# Patient Record
Sex: Female | Born: 1945 | Race: Black or African American | Hispanic: No | Smoking: Current every day smoker
Health system: Southern US, Community
[De-identification: ages and names within clinical notes are randomized; demographics above are authoritative.]

## PROBLEM LIST (undated history)

## (undated) DIAGNOSIS — I10 Essential (primary) hypertension: Secondary | ICD-10-CM

## (undated) DIAGNOSIS — F17200 Nicotine dependence, unspecified, uncomplicated: Secondary | ICD-10-CM

## (undated) DIAGNOSIS — I729 Aneurysm of unspecified site: Secondary | ICD-10-CM

## (undated) HISTORY — DX: Nicotine dependence, unspecified, uncomplicated: F17.200

## (undated) HISTORY — DX: Essential (primary) hypertension: I10

## (undated) HISTORY — PX: CEREBRAL ANEURYSM REPAIR: SHX164

## (undated) HISTORY — PX: TOTAL ABDOMINAL HYSTERECTOMY: SHX209

## (undated) HISTORY — DX: Aneurysm of unspecified site: I72.9

---

## 2002-02-16 ENCOUNTER — Ambulatory Visit (HOSPITAL_COMMUNITY): Admission: RE | Admit: 2002-02-16 | Discharge: 2002-02-16 | Payer: Self-pay | Admitting: Cardiology

## 2002-03-08 ENCOUNTER — Encounter: Admission: RE | Admit: 2002-03-08 | Discharge: 2002-03-08 | Payer: Self-pay | Admitting: Cardiology

## 2002-03-08 ENCOUNTER — Encounter: Payer: Self-pay | Admitting: Cardiology

## 2002-07-08 ENCOUNTER — Other Ambulatory Visit: Admission: RE | Admit: 2002-07-08 | Discharge: 2002-07-08 | Payer: Self-pay | Admitting: Obstetrics and Gynecology

## 2002-08-05 ENCOUNTER — Encounter: Payer: Self-pay | Admitting: Obstetrics and Gynecology

## 2002-08-15 ENCOUNTER — Encounter (INDEPENDENT_AMBULATORY_CARE_PROVIDER_SITE_OTHER): Payer: Self-pay

## 2002-08-15 ENCOUNTER — Inpatient Hospital Stay (HOSPITAL_COMMUNITY): Admission: RE | Admit: 2002-08-15 | Discharge: 2002-08-17 | Payer: Self-pay | Admitting: Obstetrics and Gynecology

## 2003-12-07 ENCOUNTER — Encounter: Admission: RE | Admit: 2003-12-07 | Discharge: 2003-12-07 | Payer: Self-pay | Admitting: Neurosurgery

## 2003-12-25 ENCOUNTER — Inpatient Hospital Stay (HOSPITAL_COMMUNITY): Admission: AD | Admit: 2003-12-25 | Discharge: 2003-12-27 | Payer: Self-pay | Admitting: Neurosurgery

## 2005-05-14 ENCOUNTER — Encounter
Admission: RE | Admit: 2005-05-14 | Discharge: 2005-05-14 | Payer: Self-pay | Admitting: Physical Medicine and Rehabilitation

## 2007-04-08 ENCOUNTER — Encounter: Admission: RE | Admit: 2007-04-08 | Discharge: 2007-04-08 | Payer: Self-pay | Admitting: Neurosurgery

## 2008-07-20 ENCOUNTER — Emergency Department (HOSPITAL_COMMUNITY): Admission: EM | Admit: 2008-07-20 | Discharge: 2008-07-20 | Payer: Self-pay | Admitting: Emergency Medicine

## 2008-07-22 ENCOUNTER — Emergency Department (HOSPITAL_COMMUNITY): Admission: EM | Admit: 2008-07-22 | Discharge: 2008-07-23 | Payer: Self-pay | Admitting: Emergency Medicine

## 2008-07-22 ENCOUNTER — Emergency Department (HOSPITAL_COMMUNITY): Admission: EM | Admit: 2008-07-22 | Discharge: 2008-07-22 | Payer: Self-pay | Admitting: Emergency Medicine

## 2009-12-24 ENCOUNTER — Ambulatory Visit (HOSPITAL_COMMUNITY): Admission: RE | Admit: 2009-12-24 | Discharge: 2009-12-24 | Payer: Self-pay | Admitting: Family Medicine

## 2010-01-31 ENCOUNTER — Ambulatory Visit: Payer: Self-pay | Admitting: Vascular Surgery

## 2010-02-28 ENCOUNTER — Ambulatory Visit: Payer: Self-pay | Admitting: Cardiology

## 2010-02-28 ENCOUNTER — Inpatient Hospital Stay (HOSPITAL_COMMUNITY): Admission: EM | Admit: 2010-02-28 | Discharge: 2010-03-03 | Payer: Self-pay | Admitting: Emergency Medicine

## 2010-03-01 ENCOUNTER — Encounter (INDEPENDENT_AMBULATORY_CARE_PROVIDER_SITE_OTHER): Payer: Self-pay | Admitting: Internal Medicine

## 2010-03-01 ENCOUNTER — Ambulatory Visit: Payer: Self-pay | Admitting: Vascular Surgery

## 2010-07-16 ENCOUNTER — Encounter (INDEPENDENT_AMBULATORY_CARE_PROVIDER_SITE_OTHER): Payer: Self-pay | Admitting: Internal Medicine

## 2010-07-16 ENCOUNTER — Ambulatory Visit
Admission: RE | Admit: 2010-07-16 | Discharge: 2010-07-16 | Payer: Self-pay | Source: Home / Self Care | Attending: Internal Medicine | Admitting: Internal Medicine

## 2010-10-04 LAB — LIPID PANEL: VLDL: 24 mg/dL (ref 0–40)

## 2010-10-04 LAB — BASIC METABOLIC PANEL
BUN: 11 mg/dL (ref 6–23)
BUN: 6 mg/dL (ref 6–23)
BUN: 9 mg/dL (ref 6–23)
CO2: 24 mEq/L (ref 19–32)
CO2: 25 mEq/L (ref 19–32)
Chloride: 106 mEq/L (ref 96–112)
Chloride: 108 mEq/L (ref 96–112)
Creatinine, Ser: 0.63 mg/dL (ref 0.4–1.2)
GFR calc non Af Amer: >60 mL/min (ref >60)
Glucose, Bld: 110 mg/dL — ABNORMAL HIGH (ref 70–99)
Glucose, Bld: 116 mg/dL — ABNORMAL HIGH (ref 70–99)
Glucose, Bld: 116 mg/dL — ABNORMAL HIGH (ref 70–99)
Potassium: 2.9 mEq/L — ABNORMAL LOW (ref 3.5–5.1)
Potassium: 3.7 mEq/L (ref 3.5–5.1)
Potassium: 4.1 mEq/L (ref 3.5–5.1)
Sodium: 137 mEq/L (ref 135–145)
Sodium: 141 mEq/L (ref 135–145)

## 2010-10-04 LAB — DIFFERENTIAL
Basophils Relative: 0 % (ref 0–1)
Eosinophils Absolute: 0.1 10*3/uL (ref 0.0–0.7)
Eosinophils Relative: 2 % (ref 0–5)
Lymphocytes Relative: 34 % (ref 12–46)
Lymphs Abs: 2.9 10*3/uL (ref 0.7–4.0)
Monocytes Absolute: 0.6 10*3/uL (ref 0.1–1.0)
Monocytes Relative: 7 % (ref 3–12)
Neutro Abs: 5 10*3/uL (ref 1.7–7.7)
Neutrophils Relative %: 57 % (ref 43–77)

## 2010-10-04 LAB — POCT I-STAT, CHEM 8
BUN: 8 mg/dL (ref 6–23)
Chloride: 110 mEq/L (ref 96–112)
Glucose, Bld: 98 mg/dL (ref 70–99)
Hemoglobin: 17.7 g/dL — ABNORMAL HIGH (ref 12.0–15.0)
Potassium: 3.6 mEq/L (ref 3.5–5.1)
Sodium: 141 mEq/L (ref 135–145)

## 2010-10-04 LAB — URINALYSIS, ROUTINE W REFLEX MICROSCOPIC
Hgb urine dipstick: NEGATIVE
Ketones, ur: NEGATIVE mg/dL
Nitrite: NEGATIVE
Specific Gravity, Urine: 1.02 (ref 1.005–1.030)
Urobilinogen, UA: 0.2 mg/dL (ref 0.0–1.0)

## 2010-10-04 LAB — CARDIAC PANEL(CRET KIN+CKTOT+MB+TROPI)
CK, MB: 1.7 ng/mL (ref 0.3–4.0)
Relative Index: 1.5 (ref 0.0–2.5)
Relative Index: 1.6 (ref 0.0–2.5)
Total CK: 113 U/L (ref 7–177)
Troponin I: 0.01 ng/mL (ref 0.00–0.06)
Troponin I: 0.02 ng/mL (ref 0.00–0.06)

## 2010-10-04 LAB — PROTIME-INR: INR: 1 (ref 0.00–1.49)

## 2010-10-04 LAB — ALDOSTERONE + RENIN ACTIVITY W/ RATIO: Aldosterone: <1 ng/dL

## 2010-10-04 LAB — CBC
HCT: 43 % (ref 36.0–46.0)
MCH: 32.6 pg (ref 26.0–34.0)
MCHC: 35 g/dL (ref 30.0–36.0)
MCV: 93.1 fL (ref 78.0–100.0)
MCV: 93.1 fL (ref 78.0–100.0)
Platelets: 285 10*3/uL (ref 150–400)
RBC: 5.21 MIL/uL — ABNORMAL HIGH (ref 3.87–5.11)
RDW: 14 % (ref 11.5–15.5)
RDW: 14.1 % (ref 11.5–15.5)
WBC: 8.7 10*3/uL (ref 4.0–10.5)
WBC: 9.2 10*3/uL (ref 4.0–10.5)

## 2010-10-04 LAB — URINE CULTURE: Culture  Setup Time: 201108120041

## 2010-10-04 LAB — POCT CARDIAC MARKERS
CKMB, poc: <1 ng/mL — ABNORMAL LOW (ref 1.0–8.0)
Troponin i, poc: <0.05 ng/mL (ref 0.00–0.09)

## 2010-10-04 LAB — TSH: TSH: 1.896 u[IU]/mL (ref 0.350–4.500)

## 2010-10-04 LAB — METANEPHRINES, PLASMA
Metanephrine, Free: <25 pg/mL (ref <=57)
Total Metanephrines-Plasma: 59 pg/mL (ref <=205)

## 2010-10-04 LAB — BRAIN NATRIURETIC PEPTIDE: Pro B Natriuretic peptide (BNP): <30 pg/mL (ref 0.0–100.0)

## 2010-11-04 LAB — POCT I-STAT, CHEM 8
BUN: 8 mg/dL (ref 6–23)
Chloride: 106 mEq/L (ref 96–112)
Potassium: 3.2 mEq/L — ABNORMAL LOW (ref 3.5–5.1)
Sodium: 138 mEq/L (ref 135–145)
TCO2: 24 mmol/L (ref 0–100)

## 2010-11-04 LAB — ABO/RH: ABO/RH(D): B POS

## 2010-11-04 LAB — TYPE AND SCREEN: ABO/RH(D): B POS

## 2010-12-03 NOTE — Consult Note (Signed)
NEW PATIENT CONSULTATION   Erica Baker, Erica Baker  DOB:  [birth year]                                       01/31/2010  ZOXWR#:[id number]   I saw the patient in the office today in consultation concerning an  abdominal aortic aneurysm.  She was referred by Dr. Lovell Sheehan.  This is a  pleasant 65 year old woman who was having some vague chest discomfort  and also some nausea.  Part of her workup for this included an  ultrasound of the abdomen which was performed at [facility] on  12/24/2009.  This showed an infrarenal abdominal aortic aneurysm with a  maximum diameter of 3.3 cm.  She was sent for vascular consultation.  The patient has not had any new onset abdominal pain.  She has had some  chronic left-sided lower back pain which she has had since a fall down  some stairs approximately 7 years ago.  Her pain in her back is brought  on by activity and relieved somewhat with rest and leg elevation.  There  are no other aggravating or alleviating factors.   Her past medical history is significant for hypertension.  She denies  any history of diabetes, hypercholesterolemia, history of previous  myocardial infarction, history of congestive heart failure or history of  COPD.   Her past surgical history is significant for removal of a cyst from her  lower back in 2004.  She had a hysterectomy in 2003.   FAMILY HISTORY:  She is unaware of any history of abdominal aortic  aneurysms in her family.  She has had no history of premature  cardiovascular disease.   SOCIAL HISTORY:  She is divorced.  She has one child.  She smokes three-  quarters of a pack per day of cigarettes and has been smoking since she  was a teenager.   REVIEW OF SYSTEMS:  GENERAL:  She has had no recent weight loss, weight  gain or problems with her appetite.  She is 225 pounds, 5 feet 8 inches  tall.  CARDIOVASCULAR:  She has had no chest pain, chest pressure, palpitations  or arrhythmias.  She  does admit to some dyspnea on exertion and  orthopnea at times.  She has had some pain in the posterior aspect of  the right leg since her fall 7 years ago.  I do not get any clear-cut  history of claudication, rest pain or nonhealing ulcers.  She has had no  history of stroke, TIAs or amaurosis fugax.  She has had no history of  DVT or phlebitis.  GI:  She has occasional constipation.  She has no history of reflux or  hiatal hernia.  MUSCULOSKELETAL:  She has some muscle pain in her right leg as described  above.  She has had no significant arthritis.  ENT:  She has occasional nosebleeds off and on for the last 2 years.  Pulmonary, GU, neurologic, psychiatric, hematologic and integumentary  review of systems is unremarkable and is documented on the medical  history form in her chart.   PHYSICAL EXAMINATION:  General:  This is a pleasant 65 year old woman  who appears her stated age.  Vital signs:  Her blood pressure is  188/104, heart rate is 66, respiratory rate is 20.  HEENT:  Unremarkable.  Lungs:  Are clear bilaterally to auscultation without  rales, rhonchi  or wheezing.  Cardiovascular:  I do not detect any  carotid bruits.  She has a regular rate and rhythm.  She has palpable  femoral pulses and warm, well-perfused feet without ischemic ulcers.  She has mild bilateral lower extremity swelling.  Abdomen:  Her abdomen  is soft and nontender.  She has normal pitched bowel sounds.  I cannot  palpate her aneurysm because of her size.  Musculoskeletal:  There are  no major deformities or cyanosis.  Neurological:  She has no focal  weakness or paresthesias.  Skin:  There are no ulcers or rashes.   I have reviewed her ultrasound which shows a maximum diameter of her  aneurysm is 3.3 cm.  I have also reviewed her EKG which shows a normal  sinus rhythm.   I have explained that we typically would not consider elective repair of  an aneurysm in a normal risk patient unless it reached 5.5  cm in maximum  diameter.  I have explained that the two main risk factors associated  with continued enlargement of the aneurysm are poorly controlled blood  pressure and smoking.  I have had a long discussion with her about the  importance of tobacco cessation and she does understand that continued  tobacco use does increase her risk of aneurysm enlargement and rupture.  I have given her literature for [facility] free tobacco smoking  cessation program on the second Wednesday of every month and also  information for [facility] tobacco cessation program.  I have ordered a  followup ultrasound in 6 months and I will see her back at that time.  If the aneurysm is stable in size we will likely stretch her followup  out to 1 year.  We have also spent some time today discussing the  options of repair if in fact her aneurysm ever enlarges enough to  consider elective repair.     Di Kindle. Edilia Bo, M.D.  Electronically Signed   CSD/MEDQ  D:  01/31/2010  T:  02/01/2010  Job:  [id number]   cc:   Della Goo, M.D.

## 2010-12-06 NOTE — Op Note (Signed)
Erica Baker, WHITIS                         ACCOUNT NO.:  [id number]   MEDICAL RECORD NO.:  [id number]                   PATIENT TYPE:  OIB   LOCATION:  2860                                 FACILITY:  [facility]   PHYSICIAN:  Donalee Citrin, M.D.                     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   OPERATION PERFORMED:  Decompressive laminectomy L4-5 for resection of right-  sided large synovial cyst with associated hemorrhage.   SURGEON:  Donalee Citrin, M.D.   ASSISTANT:  Tia Alert, MD   ANESTHESIA:  General endotracheal.   INDICATIONS FOR PROCEDURE:  The patient is a very pleasant 65 year old  female who has had back and leg pain ever since a work related injury and  fall several months ago.  The patient has refractory back and right leg pain  radiating down to the top of the big toe with weakness on dorsiflexion and  extensor hallucis longus.  Preoperative imaging showed what appeared to be a  large synovial cyst.  Patient's symptomatology began after this work related  injury, was completely asymptomatic before and afterward.  Due to the  appearance of the synovial cyst, it was felt probably could represent some  hemorrhage overlying within the cyst.  Due to failure of conservative  treatment and progressive weakness of the right foot, the patient is  recommended decompressive laminectomy and resection of cysts.  I extensively  went over the risks and benefits of surgery.  She understands and agrees to  proceed forward.   DESCRIPTION OF PROCEDURE:  The patient was brought to the operating room  where she was induced under general anesthesia.  The patient was placed  prone on the Wilson frame.  Back was prepped and draped in the usual sterile  fashion.  Preoperative x-ray  localized the L4-5 disk space.  After  infiltration of 10 mL of lidocaine with epinephrine, a midline incision was  made and Bovie electrocautery was used to taken down the subcutaneous tissue  and subperiosteal dissection was carried out to the lamina of L4 and L5 on  the right.  Then the self-retaining retractor was placed.  Intraoperative x-  ray confirmed localization of this to be the L4-5 disk space.  Then using a  high speed drill, the medial aspect of the facet complex and inferior aspect  of lamina of L4 was drilled down.  Then using a 3 and 4 mm Kerrison punches  transversely the entire lamina of L4 and half the superior aspect of the  lamina of L5 was removed, medial aspect of the facet complex exposing  severely hypertrophied ligament.  The ligament was removed in piecemeal  fashion exposing the  central thecal sac.  Then using a 4 Penfield, the  remainder of the lateral ligament was resected off the thecal sac and the  large synovial cyst was immediately visualized.  The synovial cyst was  dissected free of the thecal sac and identified from its cephalocaudad  dimensions.  After the thecal  sac and underlying L5 nerve root was  dissected off the medial aspect of the cyst, the cyst was underbitten from  cephalad to caudad dimensions.  Upon immediately abutting into the cyst, a  large amount of clear synovial fluid was expressed as well as a large amount  of dark black old blood.  This cyst was progressively resected with a  combination of blunt dissection with a penfield and Kerrison rongeurs and  pituitary rongeurs.  After resection of the cyst, the lateral gutter and  pedicle was scraped to remove any residual lateral cyst wall.  The L5 nerve  root was clearly identified out is foramen and noted to be free of any  further remnants of the cyst.  The cyst was completely resected and it was  felt no further wall remained.  The disk was intact and it was not necessary  to violate  this.  After resection of the cyst, the thecal sac and L5 nerve  root were markedly decompressed.  The wound was then copiously irrigated  and meticulous hemostasis was maintained.  Gelfoam was overlaid on top of  the dura.  The muscle and fascia were reapproximated with 0 interrupted  Vicryl.  The subcutaneous tissue was closed with 2-0 interrupted Vicryl.  The skin was closed with running 4-0 subcuticular.  Benzoin and Steri-Strips  applied.  The patient was then transferred to the recovery room in stable  condition.  At the end of the case, the sponge and needle counts were  correct.                                               Donalee Citrin, M.D.    GC/MEDQ  D:  12/25/2003  T:  12/25/2003  Job:  [id number]

## 2010-12-06 NOTE — H&P (Signed)
Erica Baker, Erica Baker                         ACCOUNT NO.:  [id number]   MEDICAL RECORD NO.:  [id number]                   PATIENT TYPE:  INP   LOCATION:  NA                                   FACILITY:  [facility]   PHYSICIAN:  Daniel L. Eda Paschal, M.D.           DATE OF BIRTH:  03-30-1946   DATE OF ADMISSION:  DATE OF DISCHARGE:                                HISTORY & PHYSICAL   CHIEF COMPLAINT:  Multiple leiomyoma uteri with persistent menometorrhagia.   HISTORY OF PRESENT ILLNESS:  The patient is a 65 year old, gravida 1, para  1, AB 0, who came to my office with a history of persistent menometorrhagia.  She has been treated both with oral progestins and Megace without any  relief.  Endometrial biopsy came back benign.  Sonohysterogram was done.  The patient has multiple myomas including a submucous one which accounts for  the above.  She has a total of three fibroids that are 3 cm or more.  As a  result of all of the above, she now enters the hospital for total abdominal  hysterectomy and bilateral salpingo-oophorectomy.  It is possible that one  of her ovaries was removed back in 1980-something, but she is not sure.  But  regardless, she would like to have her ovaries removed at 56 at the time of  the surgery.   PAST MEDICAL HISTORY:  The patient is hypertensive and takes Lasix and  potassium for the above.  The patient also has history of GERD and treats  that occasionally.   PRESENT MEDICATIONS:  Lasix and potassium.   ALLERGIES:  None.   FAMILY HISTORY:  Her mother and father had hypertension.   PAST SURGICAL HISTORY:  The patient had laparotomy with possible removal of  one ovary and tube in 1980's.  She is not really sure why that was done,  although she thought it might have been for endometriosis.  She has also had  a benign lump removed from her breast.   REVIEW OF SYSTEMS:  HEENT:  Negative.  CARDIAC:  Hypertension.  GI:  GERD.  RESPIRATORY:  A history of  coughing.  GU:  Negative.  NEUROMUSCULAR:  Negative.  ALLERGIC:  Negative.  IMMUNOLOGICAL:  Negative.  LYMPHATIC:  Negative.  ENDOCRINE:  Negative.   PHYSICAL EXAMINATION:  GENERAL:  The patient is a well-developed, well-  nourished female in no acute distress.  VITAL SIGNS:  Blood pressure 150/80, pulse 80 and regular, respirations 16  and nonlabored.  She is afebrile.  HEENT:  All within normal limits.  NECK:  Supple.  Trachea in the midline.  Thyroid is not enlarged.  LUNGS:  Clear to P&A.  HEART:  No thrills, heaves, or murmurs.  BREASTS:  No masses.  ABDOMEN:  Soft without guarding, rebound, or masses.  PELVIC:  External and vaginal is within normal limits.  Cervix is clean.  Pap smear shows no atypia.  Uterus is enlarged by multiple myomas to almost  three times normal size.  Adnexa fails to reveal any pathology.  Rectovaginal is negative.  EXTREMITIES:  Within normal limits.   ADMISSION IMPRESSION:  Persistent menometrorrhagia with leiomyoma uteri.   PLAN:  Total abdominal hysterectomy, bilateral salpingo-oophorectomy.   ADDENDUM TO THE ABOVE:  A more precise listing of her medications:  1. Megace 40 mg t.i.d.  2. Nifedipine ER 60 mg 1 daily.  3. Naproxen DS 1 t.i.d. p.r.n.  4. Potassium 10 mg 1 daily.  5. Bumetanide 0.5 mg 1 daily p.r.n.                                               Daniel L. Eda Paschal, M.D.    Erica Baker  D:  08/15/2002  T:  08/15/2002  Job:  [id number]

## 2010-12-06 NOTE — Discharge Summary (Signed)
   Erica Baker, Erica Baker                         ACCOUNT NO.:  [id number]   MEDICAL RECORD NO.:  [id number]                   PATIENT TYPE:  INP   LOCATION:  0464                                 FACILITY:  [facility]   PHYSICIAN:  Daniel L. Eda Paschal, M.D.           DATE OF BIRTH:  [birth year]   DATE OF ADMISSION:  08/15/2002  DATE OF DISCHARGE:  08/17/2002                                 DISCHARGE SUMMARY   HOSPITAL COURSE:  The patient is a 65 year old female who was admitted to  the hospital with multiple fibroids and persistent menometorrhagia for  tentative surgery.  On the day of admission she was taken to the operating  room.  Exploratory laparotomy was performed.  The patient had massive pelvic  adhesions.  All pelvic adhesions were lysed.  She then underwent a total  abdominal hysterectomy, left salpingo-oophorectomy.  Postoperatively her  course continued uneventfully.  By the second postoperative day she was  ready for discharge.   DISCHARGE MEDICATIONS:  Percocet or Motrin for pain relief.   ACTIVITY:  Ambulatory.   DIET:  Regular.   FOLLOW-UP:  She is going to return to the office one week from her surgery  for staple removal.   PATHOLOGY:  Final pathology revealed uterus with multiple leiomyomata, tubal  endometriosis, uterine serosal endosalpingiosis.   CONDITION ON DISCHARGE:  Improved.   DISCHARGE DIAGNOSES:  1. Menometrorrhagia.  2. Endometriosis.  3. Leiomyomata uteri.  4. Endosalpingiosis.  5. Pelvic adhesive disease.   OPERATION:  1. Exploratory laparotomy with lysis of pelvic adhesions.  2. Total abdominal hysterectomy.  3. Left salpingo-oophorectomy.                                               Daniel L. Eda Paschal, M.D.    Tonette Bihari  D:  08/31/2002  T:  08/31/2002  Job:  [id number]

## 2010-12-06 NOTE — Op Note (Signed)
Erica Baker, Erica Baker                         ACCOUNT NO.:  [id number]   MEDICAL RECORD NO.:  [id number]                   PATIENT TYPE:  INP   LOCATION:  X009                                 FACILITY:  [facility]   PHYSICIAN:  Rande Brunt. Eda Paschal, M.D.           DATE OF BIRTH:  12/10/1945   DATE OF PROCEDURE:  08/15/2002  DATE OF DISCHARGE:                                 OPERATIVE REPORT   PREOPERATIVE DIAGNOSES:  1. Menometrorrhagia.  2. Fibroids.   POSTOPERATIVE DIAGNOSES:  1. Menometrorrhagia.  2. Fibroids.  3. Pelvic adhesive disease.   PROCEDURES:  1. Exploratory laparotomy with lysis of pelvic adhesions.  2. Total abdominal hysterectomy, left salpingo-oophorectomy.   SURGEON:  Daniel L. Eda Paschal, M.D.   ASSISTANTNadyne Coombes. Fontaine, M.D.   ANESTHESIA:  General endotracheal.   FINDINGS:  At the time of this surgery the patient's peritoneal cavity was  severely distorted by adhesive disease and there was mesentery that was  completely layered over the uterus and attached to the peritoneum, making  identification of the uterus and the left ovary and tube impossible  initially.  Once this had been removed, her left tube was dilated by a  hydrosalpinx and her left ovary was densely adherent to the peritoneum.  The  sigmoid colon was densely adherent to the uterus posteriorly.  The right  ovary and fallopian tube were surgically absent.   DESCRIPTION OF PROCEDURE:  After adequate general endotracheal anesthesia,  the patient was placed in the supine position and prepped and draped in the  usual sterile manner.  A Foley catheter was inserted in the patient's  bladder. The patient's previous midline vertical incision was removed.  The  fascia was opened vertically, as was the peritoneum.  Subcutaneous bleeders  were clamped and bovied as encountered.  When the peritoneal cavity was  opened, the above findings were noted.  First mesentery was freed up from  the top of  the uterus from the peritoneum and the excessive mesentery was  excised.  Some of it was omentum and  some of it was mesentery from the  sigmoid.  Most of it was omentum.  Once this had been done, the uterus could  be identified.  The colon was densely adherent to it.  The round ligaments  could be identified.  They were sutured and cut.  The retroperitoneal space  was entered.  The ureters were identified bilaterally.  On the left the  infundibulopelvic ligament was clamped, cut, and doubly suture ligated.  On  the right the utero-ovarian ligament was clamped, cut, and doubly suture  ligated as there was no ovary and tube on the right.  The bladder flap was  taken down by sharp dissection . The uterine arteries were clamped, cut, and  doubly suture ligated  with #1 chromic catgut.  At this point a  supracervical hysterectomy was done and then taking extreme care, the  sigmoid colon was dissected off the cervix until it was below the vagina.  The parametrium was then taken down in successive bites by clamping,  cutting, and suture ligating.  The cervicovaginal junction was identified  and entered with sharp dissection, and the cervix was also removed.  Suture  material for the above-mentioned pedicles was #1 chromic catgut.  The angles  of the vagina were sutured with #1 chromic catgut, incorporating cardinal  and uterosacral ligaments.  The vaginal cuff was whipstitched with a running  locking 0 Vicryl.  Copious irrigation was done with Ringer's lactate.  The  ureters were once rechecked.  They still were free without any injury.  The  sigmoid colon was free without any injury.  There was no bleeding noted.  The peritoneum and the fascia were closed with two running 0 PDS using  looped 0 PDS.  Copious irrigation was then done in the suprafascial area,  having previously copiously irrigated the peritoneal cavity, and then the  skin was closed with staples.  Estimated blood loss for the  entire procedure  was 200 mL with none replaced.  The patient tolerated the procedure well and  left the operating room in satisfactory condition, draining clear urine from  her Foley catheter.                                               Daniel L. Eda Paschal, M.D.    Tonette Bihari  D:  08/15/2002  T:  08/15/2002  Job:  [id number]

## 2010-12-13 ENCOUNTER — Emergency Department (HOSPITAL_COMMUNITY)
Admission: EM | Admit: 2010-12-13 | Discharge: 2010-12-14 | Disposition: A | Discharge status: Home / Self Care | Payer: BC Managed Care – PPO | Attending: Emergency Medicine | Admitting: Emergency Medicine

## 2010-12-13 ENCOUNTER — Emergency Department (HOSPITAL_COMMUNITY): Payer: BC Managed Care – PPO

## 2010-12-13 DIAGNOSIS — I10 Essential (primary) hypertension: Secondary | ICD-10-CM | POA: Insufficient documentation

## 2010-12-13 DIAGNOSIS — Z7982 Long term (current) use of aspirin: Secondary | ICD-10-CM | POA: Insufficient documentation

## 2010-12-13 DIAGNOSIS — R109 Unspecified abdominal pain: Secondary | ICD-10-CM | POA: Insufficient documentation

## 2010-12-13 DIAGNOSIS — Z79899 Other long term (current) drug therapy: Secondary | ICD-10-CM | POA: Insufficient documentation

## 2010-12-13 LAB — COMPREHENSIVE METABOLIC PANEL
ALT: 6 U/L (ref 0–35)
AST: 12 U/L (ref 0–37)
Albumin: 3.5 g/dL (ref 3.5–5.2)
Alkaline Phosphatase: 63 U/L (ref 39–117)
Chloride: 102 mEq/L (ref 96–112)
GFR calc Af Amer: >60 mL/min (ref >60)
Potassium: 2.8 mEq/L — ABNORMAL LOW (ref 3.5–5.1)
Total Bilirubin: 0.5 mg/dL (ref 0.3–1.2)

## 2010-12-13 LAB — URINALYSIS, ROUTINE W REFLEX MICROSCOPIC
Glucose, UA: NEGATIVE mg/dL
Protein, ur: NEGATIVE mg/dL
Specific Gravity, Urine: 1.016 (ref 1.005–1.030)
Urobilinogen, UA: 1 mg/dL (ref 0.0–1.0)

## 2010-12-13 LAB — DIFFERENTIAL
Basophils Relative: 0 % (ref 0–1)
Eosinophils Absolute: 0.2 10*3/uL (ref 0.0–0.7)
Lymphs Abs: 3.8 10*3/uL (ref 0.7–4.0)
Neutro Abs: 3.6 10*3/uL (ref 1.7–7.7)
Neutrophils Relative %: 44 % (ref 43–77)

## 2010-12-13 LAB — CBC
Platelets: 298 10*3/uL (ref 150–400)
RBC: 4.86 MIL/uL (ref 3.87–5.11)
WBC: 8.3 10*3/uL (ref 4.0–10.5)

## 2010-12-14 MED ORDER — IOHEXOL 350 MG/ML SOLN
100.0000 mL | Freq: Once | INTRAVENOUS | Status: AC | PRN
  Administered 2010-12-14: 100 mL via INTRAVENOUS

## 2011-04-25 LAB — CBC
MCHC: 34 g/dL (ref 30.0–36.0)
MCV: 91.3 fL (ref 78.0–100.0)
Platelets: 312 10*3/uL (ref 150–400)

## 2011-04-25 LAB — DIFFERENTIAL
Basophils Relative: 1 % (ref 0–1)
Eosinophils Absolute: 0.1 10*3/uL (ref 0.0–0.7)
Monocytes Relative: 5 % (ref 3–12)
Neutrophils Relative %: 65 % (ref 43–77)

## 2011-04-25 LAB — BASIC METABOLIC PANEL
Calcium: 9.2 mg/dL (ref 8.4–10.5)
GFR calc Af Amer: >60 mL/min (ref >60)
GFR calc non Af Amer: >60 mL/min (ref >60)
Glucose, Bld: 116 mg/dL — ABNORMAL HIGH (ref 70–99)
Sodium: 137 mEq/L (ref 135–145)

## 2011-08-18 IMAGING — CT CT CTA ABD/PEL W/CM AND/OR W/O CM
2 of 7 series · 15 of 46 positions shown, 19 images · IV contrast (omnipaque)
Comparison: Ultrasound 12/24/2009.

CLINICAL DATA: Lower abdominal pain radiating to lower back.
Aneurysm is seen on outside ultrasound.

CT ANGIOGRAPHY ABDOMEN AND PELVIS
TECHNIQUE: Multidetector CT imaging of the abdomen and pelvis was
performed using the standard protocol during bolus administration
of intravenous contrast.  Multiplanar reconstructed images
including MIPs were obtained and reviewed to evaluate the vascular
anatomy.
Contrast:  100 ml Omnipaque 350

[Series 6: dissection 2.0 st · axial · 0.78mm/px · z∈[-687,-337]mm · 12 of 205 slices shown, 16 images]
[im 20/205  soft-tissue]
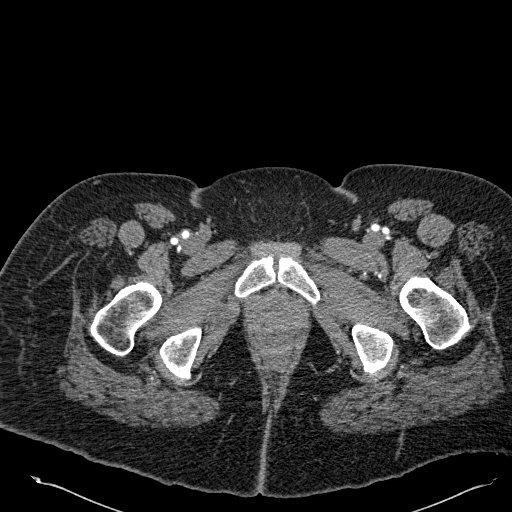
[im 20/205  bone]
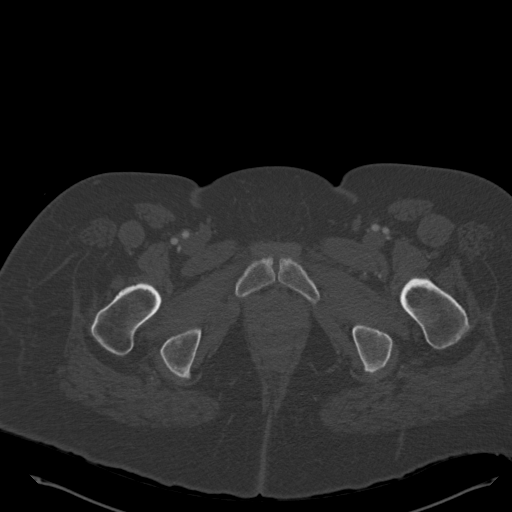
[im 39/205  soft-tissue]
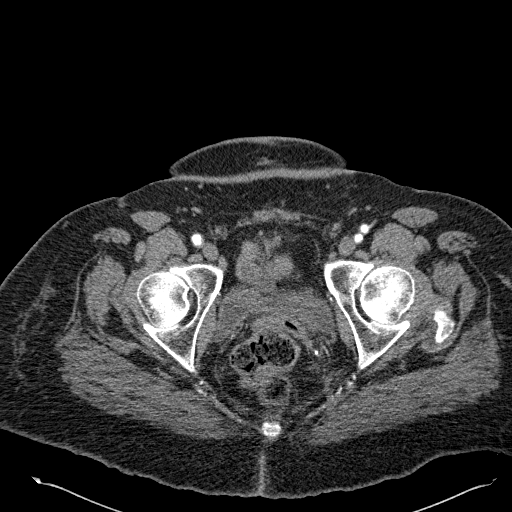
[im 59/205  soft-tissue]
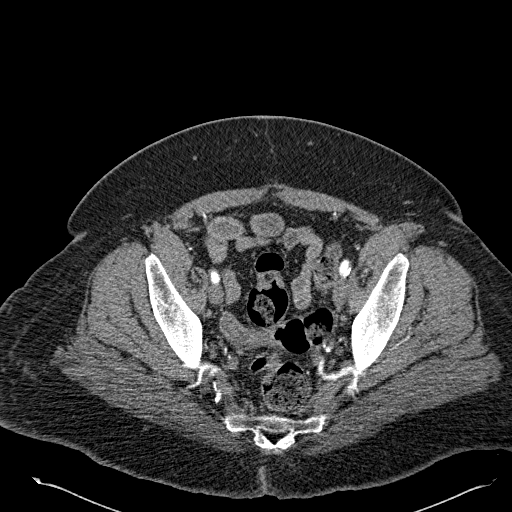
[im 78/205  soft-tissue]
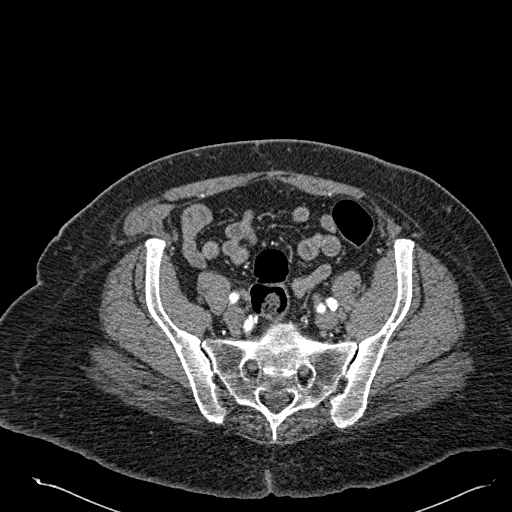
[im 98/205  soft-tissue]
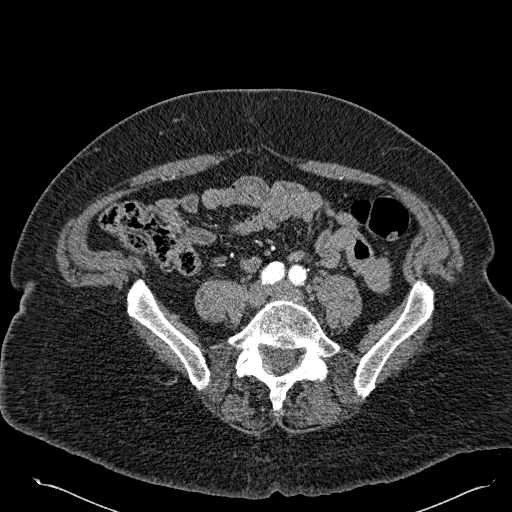
[im 117/205  soft-tissue]
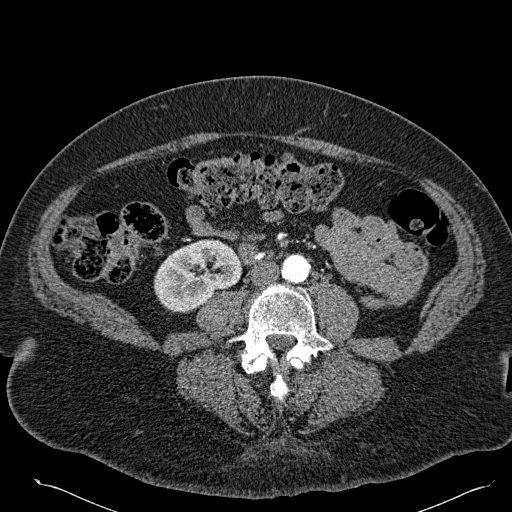
[im 137/205  soft-tissue]
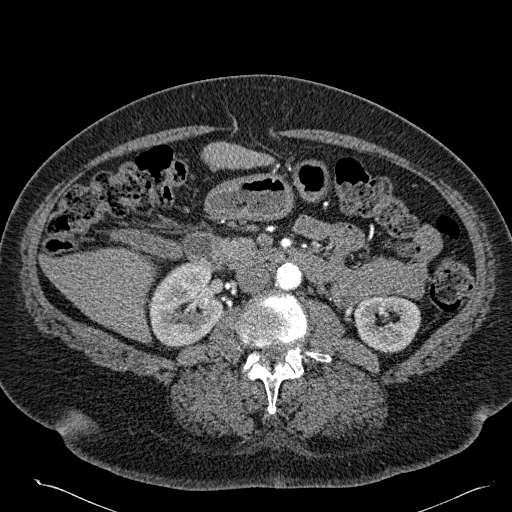
[im 156/205  soft-tissue]
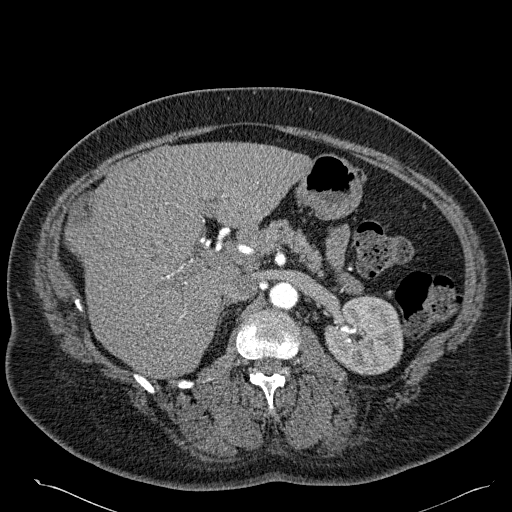
[im 166/205  lung]
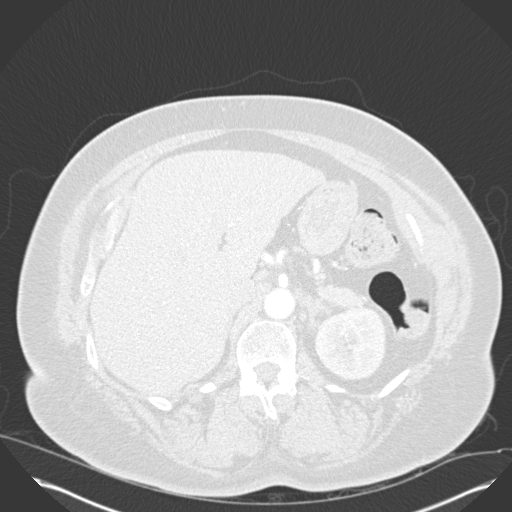
[im 175/205  soft-tissue]
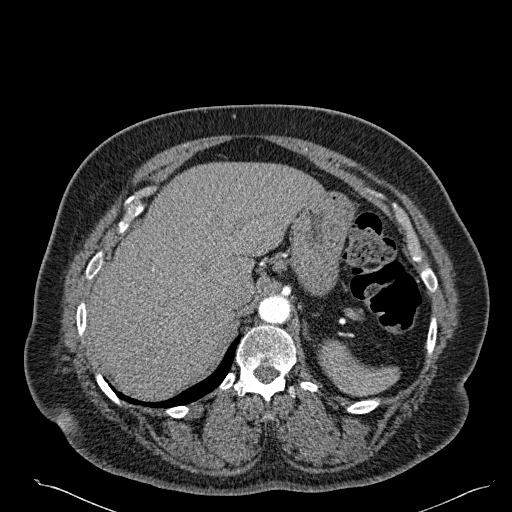
[im 175/205  lung]
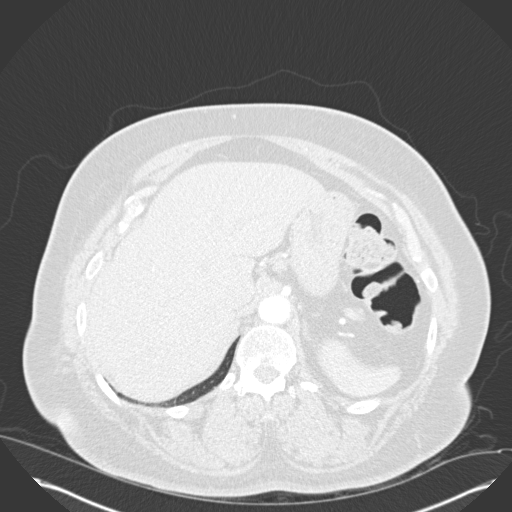
[im 175/205  bone]
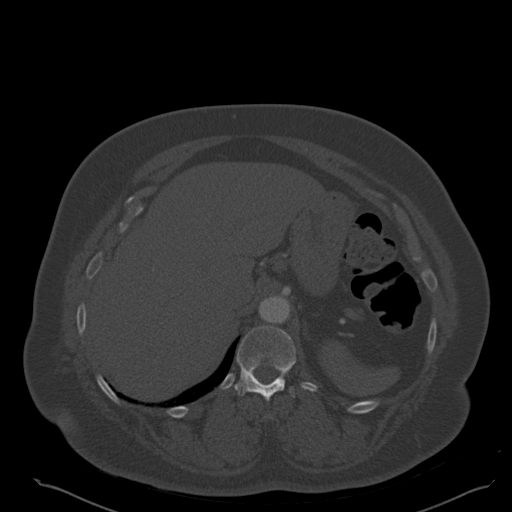
[im 185/205  lung]
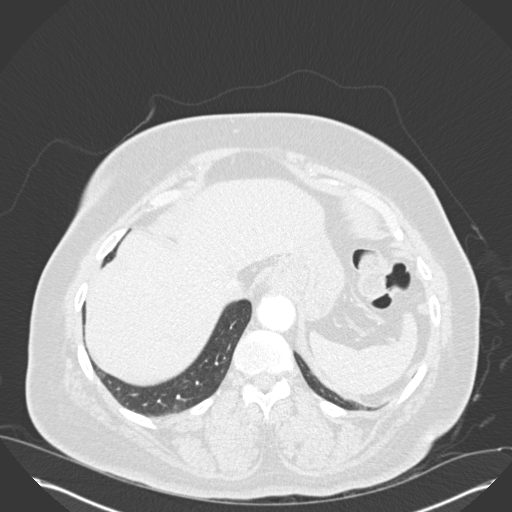
[im 195/205  soft-tissue]
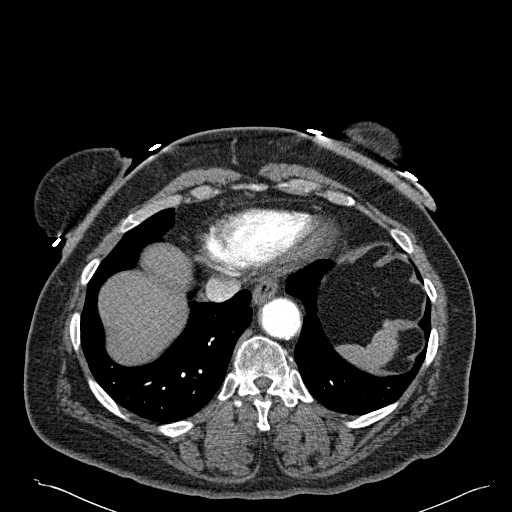
[im 195/205  lung]
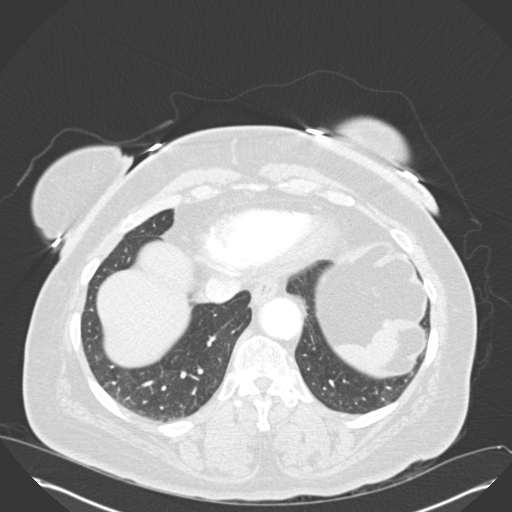

[Series 602: coronal · coronal · 0.80mm/px · 3 of 116 slices shown]
[im 29/116  soft-tissue]
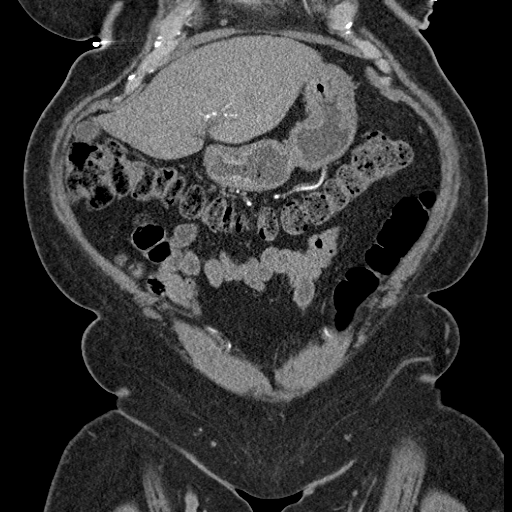
[im 58/116  soft-tissue]
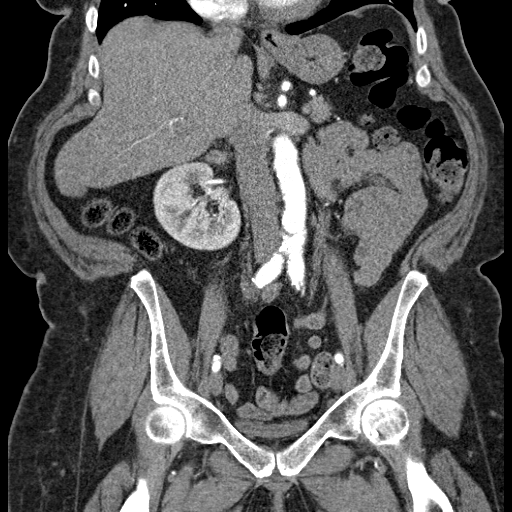
[im 87/116  soft-tissue]
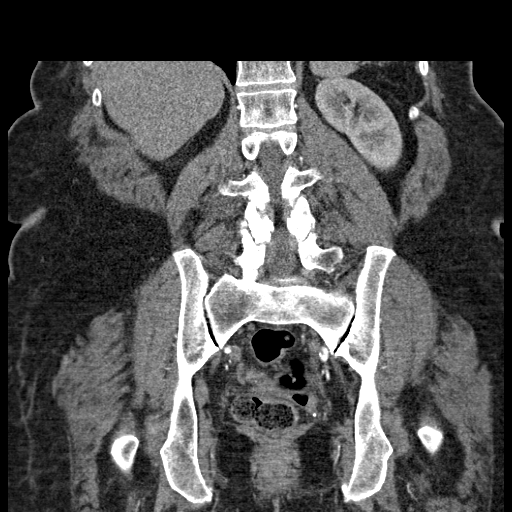

[15 of 46 positions shown; findings below may reference images not displayed]

FINDINGS: Non-IV contrast images demonstrates no intramural
hematoma within the abdominal aorta.  The aorta is nonaneurysmal
with the suprarenal portion measuring 2.5 cm through the
diaphragmatic hiatus.  The infrarenal abdominal aorta measures
x 2.1 cm in maximal dimension.  There is no evidence of aneurysm of
the common iliacs, internal iliac or external iliac arteries.  The
celiac trunk and SMA are patent.  There is a single renal artery on
the left and dominant and accessory  renal artery on the right.

Lung bases are clear.  No pericardial fluid.  No focal hepatic
lesion.  Gallbladder, pancreas, spleen, adrenal glands, and kidneys
are normal.

The stomach, small bowel, and cecum are normal.  The colon and
rectosigmoid colon are normal.

No free fluid the pelvis.  The bladder is normal.  Post
hysterectomy anatomy.  No pelvic lymphadenopathy. Review of  bone
windows demonstrates no aggressive osseous lesions.

 Review of the MIP images confirms the above findings.
IMPRESSION: 1.  No evidence of abdominal aortic aneurysm.  No evidence of
iliac aneurysm.  No evidence of dissection.

## 2013-11-15 ENCOUNTER — Ambulatory Visit (INDEPENDENT_AMBULATORY_CARE_PROVIDER_SITE_OTHER): Payer: Medicare Other | Admitting: Family Medicine

## 2013-11-15 ENCOUNTER — Encounter: Payer: Self-pay | Admitting: Family Medicine

## 2013-11-15 VITALS — BP 130/86 | HR 84 | Temp 99.0°F | Ht 68.0 in | Wt 222.0 lb

## 2013-11-15 DIAGNOSIS — M7130 Other bursal cyst, unspecified site: Secondary | ICD-10-CM

## 2013-11-15 DIAGNOSIS — I10 Essential (primary) hypertension: Secondary | ICD-10-CM

## 2013-11-15 LAB — COMPREHENSIVE METABOLIC PANEL
ALT: <8 U/L (ref 0–35)
AST: 17 U/L (ref 0–37)
Albumin: 4 g/dL (ref 3.5–5.2)
Alkaline Phosphatase: 57 U/L (ref 39–117)
BILIRUBIN TOTAL: 0.9 mg/dL (ref 0.2–1.2)
BUN: 7 mg/dL (ref 6–23)
CO2: 31 mEq/L (ref 19–32)
CREATININE: 0.65 mg/dL (ref 0.50–1.10)
Calcium: 9.9 mg/dL (ref 8.4–10.5)
Chloride: 100 mEq/L (ref 96–112)
GLUCOSE: 100 mg/dL — AB (ref 70–99)
Potassium: 3.4 mEq/L — ABNORMAL LOW (ref 3.5–5.3)
Sodium: 136 mEq/L (ref 135–145)
Total Protein: 7.5 g/dL (ref 6.0–8.3)

## 2013-11-15 LAB — LIPID PANEL
CHOL/HDL RATIO: 3.8 ratio
CHOLESTEROL: 178 mg/dL (ref 0–200)
HDL: 47 mg/dL (ref >39)
LDL Cholesterol: 109 mg/dL — ABNORMAL HIGH (ref 0–99)
TRIGLYCERIDES: 110 mg/dL (ref <150)
VLDL: 22 mg/dL (ref 0–40)

## 2013-11-15 MED ORDER — AMLODIPINE BESYLATE 5 MG PO TABS
5.0000 mg | ORAL_TABLET | Freq: Every day | ORAL | Status: DC
Start: 2013-11-15 — End: 2014-05-02

## 2013-11-15 MED ORDER — LISINOPRIL-HYDROCHLOROTHIAZIDE 20-25 MG PO TABS: 1.0000 | ORAL_TABLET | Freq: Every day | ORAL | Status: DC

## 2013-11-15 NOTE — Progress Notes (Addendum)
   Subjective:    Patient ID: Erica Baker, female    DOB: 1946-01-18, 68 y.o.   MRN: [id number]  HPI Erica PriestGlenda L Etcheverry is here for establish care. Patient has a h/o of HTN, aneurysm s/p [facility] and lacunar strokes.   She has a knot on his lateral first finger on her right hand. It started a month ago. There is no pain associated with it. She has no numbness or loss of strength.  She has no history of gout or trauma to the area. She has never broken it. There is no warmth. It hasn't changed since appearing.   Patient has a history of HTN. She is compliant with her medications. She has no side effects. She reports no chest pain or lightheadedness.   No current outpatient prescriptions on file prior to visit.   No current facility-administered medications on file prior to visit.   PMh, fhx and medications were all reviewed and updated.   SHx: Current every day smoke (started when she was 68 years old)   Health Maintenance:  - Colonoscopy was performed greater than 10 years ago  she last received Tdap greater than 10 years ago,  - she reports as previously getting the pneumonia vaccine prior to the age of 68 - Mammogram was last completed greater than 2 years ago,  - never received the Zoster vaccine   Review of Systems See HPI    Objective:   Physical Exam BP 130/86  Pulse 84  Temp(Src) 99 F (37.2 C) (Oral)  Ht 5\' 8"  (1.727 m)  Wt 222 lb (100.699 kg)  BMI 33.76 kg/m2 Gen: NAD, alert, cooperative with exam, well-appearing, African American Female, has cane but only uses when she feels unstable HEENT: NCAT, PERRL, clear conjunctiva, oropharynx clear, supple neck, indention of right temporal from surgery CV: RRR, good S1/S2, no murmur, no edema, capillary refill brisk  Resp: CTABL, no wheezes, non-labored MSK: 5/5 strength in upper and lower extremities, +2 knee reflexes, normal gait,  Left hand: soft, movable mass under skin, non tender, no loss of  finger movement or sensation, pulses intact, translucent, no warmth or erythema  Skin: no rashes, normal turgor  Neuro: CN 2-12 intact,  Psych: good insight, alert and oriented     Assessment & Plan:    Will need to talk about ADL's and determine capacity going forward.  - test balance with feet together and heel to toe to determine fall risk   MMSE with a history of strokes  - will need to stay on ASA   Tobacco cessation will need to be discussed going forward

## 2013-11-15 NOTE — Patient Instructions (Signed)
Thank you for coming in,   I refilled your medications and I will get your old records.   I will get labs today and call you with the results.   Please follow up with me in 1 month.    Please feel free to call with any questions or concerns at any time, at [phone]. --Dr. Jordan LikesSchmitz

## 2013-11-15 NOTE — Assessment & Plan Note (Signed)
Translucent, nontender, and stable on left index finger at the lateral PIP joint  - if causing problems then can be drained at a later date.  - Discussed with Dr. Perley JainMcDiarmid

## 2013-11-15 NOTE — Assessment & Plan Note (Addendum)
Stable and well controlled.  - refilled Amlodipine and LIsinopril/HCTZ - AVOID beta blockers as she had heart block during previous hospital admission.  - CMP, Lipid panel today  - f/u in 1 month

## 2013-11-17 ENCOUNTER — Other Ambulatory Visit: Payer: Self-pay | Admitting: Family Medicine

## 2013-11-17 DIAGNOSIS — E785 Hyperlipidemia, unspecified: Secondary | ICD-10-CM

## 2013-11-17 MED ORDER — PRAVASTATIN SODIUM 40 MG PO TABS: 40.0000 mg | ORAL_TABLET | Freq: Every day | ORAL | Status: DC

## 2013-11-18 ENCOUNTER — Telehealth: Payer: Self-pay | Admitting: Family Medicine

## 2013-11-18 ENCOUNTER — Telehealth: Payer: Self-pay | Admitting: *Deleted

## 2013-11-18 NOTE — Telephone Encounter (Signed)
Please see previous message.please advise.Erica Baker

## 2013-11-18 NOTE — Telephone Encounter (Signed)
Patient requesting lab results from OV 4/28

## 2013-11-18 NOTE — Telephone Encounter (Signed)
Patient refuses to take statin's.She states she'll talk to you at her next visit.[name]

## 2013-11-21 NOTE — Telephone Encounter (Signed)
I again relayed message,she again refuses to start statin's.patient advised to schedule an appointment to discus this is lengh with Dr Lorelee NewSchmitz.she voiced understanding.Amedeo GoryGiovanna S Vaughan Garfinkle

## 2013-11-21 NOTE — Telephone Encounter (Signed)
Message copied by Tanna SavoyPROPOSITO, Lacy Sofia S on Mon Nov 21, 2013 11:19 AM ------      Message from: Clare GandySCHMITZ, JEREMY E      Created: Mon Nov 21, 2013  9:09 AM       Please inform patient that her labs were not worrisome. Her lipid profile suggests to start a statin but apparently she doesn't want to take one and will discuss with me why upon a later visit. Thank you ------

## 2013-11-21 NOTE — Telephone Encounter (Signed)
Please advise 

## 2013-11-21 NOTE — Telephone Encounter (Signed)
Patient very rude,she calls wanting labs results again,. I relayed Dr Jordan LikesSchmitz message again and she went off telling me her other Dr'd state that is total chlo is below 200 that's ok.I again explained labs after third time I asked her to schedule an appointment to see Dr Jordan LikesSchmitz to explain labs in length,she again states that she  refuses statins and states she'll schedule appointment when she has time.Amedeo GoryGiovanna S Carrson Lightcap

## 2013-11-21 NOTE — Telephone Encounter (Signed)
PLEASE call about lab results  She is very anxious about them

## 2013-12-21 ENCOUNTER — Encounter: Payer: Self-pay | Admitting: Family Medicine

## 2013-12-21 ENCOUNTER — Ambulatory Visit (INDEPENDENT_AMBULATORY_CARE_PROVIDER_SITE_OTHER): Payer: Medicare Other | Admitting: Family Medicine

## 2013-12-21 VITALS — BP 110/75 | HR 76 | Temp 98.2°F | Wt 223.0 lb

## 2013-12-21 DIAGNOSIS — I10 Essential (primary) hypertension: Secondary | ICD-10-CM

## 2013-12-21 DIAGNOSIS — E785 Hyperlipidemia, unspecified: Secondary | ICD-10-CM

## 2013-12-21 NOTE — Patient Instructions (Signed)
Thank you for coming in,   Lets follow up in three month to see if there are any changes in your LDL.   I think your blood pressure is doing great and keep up the good work.   Please continue to try and cut down on your smoking.    Please feel free to call with any questions or concerns at any time, at [phone]. --Dr. Jordan Likes  Hypertriglyceridemia  Diet for High blood levels of Triglycerides Most fats in food are triglycerides. Triglycerides in your blood are stored as fat in your body. High levels of triglycerides in your blood may put you at a greater risk for heart disease and stroke.  Normal triglyceride levels are less than 150 mg/dL. Borderline high levels are 150-199 mg/dl. High levels are 200 - 499 mg/dL, and very high triglyceride levels are greater than 500 mg/dL. The decision to treat high triglycerides is generally based on the level. For people with borderline or high triglyceride levels, treatment includes weight loss and exercise. Drugs are recommended for people with very high triglyceride levels. Many people who need treatment for high triglyceride levels have metabolic syndrome. This syndrome is a collection of disorders that often include: insulin resistance, high blood pressure, blood clotting problems, high cholesterol and triglycerides. TESTING PROCEDURE FOR TRIGLYCERIDES  You should not eat 4 hours before getting your triglycerides measured. The normal range of triglycerides is between 10 and 250 milligrams per deciliter (mg/dl). Some people may have extreme levels (1000 or above), but your triglyceride level may be too high if it is above 150 mg/dl, depending on what other risk factors you have for heart disease.  People with high blood triglycerides may also have high blood cholesterol levels. If you have high blood cholesterol as well as high blood triglycerides, your risk for heart disease is probably greater than if you only had high triglycerides. High blood  cholesterol is one of the main risk factors for heart disease. CHANGING YOUR DIET  Your weight can affect your blood triglyceride level. If you are more than 20% above your ideal body weight, you may be able to lower your blood triglycerides by losing weight. Eating less and exercising regularly is the best way to combat this. Fat provides more calories than any other food. The best way to lose weight is to eat less fat. Only 30% of your total calories should come from fat. Less than 7% of your diet should come from saturated fat. A diet low in fat and saturated fat is the same as a diet to decrease blood cholesterol. By eating a diet lower in fat, you may lose weight, lower your blood cholesterol, and lower your blood triglyceride level.  Eating a diet low in fat, especially saturated fat, may also help you lower your blood triglyceride level. Ask your dietitian to help you figure how much fat you can eat based on the number of calories your caregiver has prescribed for you.  Exercise, in addition to helping with weight loss may also help lower triglyceride levels.   Alcohol can increase blood triglycerides. You may need to stop drinking alcoholic beverages.  Too much carbohydrate in your diet may also increase your blood triglycerides. Some complex carbohydrates are necessary in your diet. These may include bread, rice, potatoes, other starchy vegetables and cereals.  Reduce "simple" carbohydrates. These may include pure sugars, candy, honey, and jelly without losing other nutrients. If you have the kind of high blood triglycerides that is affected by the  amount of carbohydrates in your diet, you will need to eat less sugar and less high-sugar foods. Your caregiver can help you with this.  Adding 2-4 grams of fish oil (EPA+ DHA) may also help lower triglycerides. Speak with your caregiver before adding any supplements to your regimen. Following the Diet  Maintain your ideal weight. Your caregivers  can help you with a diet. Generally, eating less food and getting more exercise will help you lose weight. Joining a weight control group may also help. Ask your caregivers for a good weight control group in your area.  Eat low-fat foods instead of high-fat foods. This can help you lose weight too.  These foods are lower in fat. Eat MORE of these:   Dried beans, peas, and lentils.  Egg whites.  Low-fat cottage cheese.  Fish.  Lean cuts of meat, such as round, sirloin, rump, and flank (cut extra fat off meat you fix).  Whole grain breads, cereals and pasta.  Skim and nonfat dry milk.  Low-fat yogurt.  Poultry without the skin.  Cheese made with skim or part-skim milk, such as mozzarella, parmesan, farmers', ricotta, or pot cheese. These are higher fat foods. Eat LESS of these:   Whole milk and foods made from whole milk, such as American, blue, cheddar, monterey jack, and swiss cheese  High-fat meats, such as luncheon meats, sausages, knockwurst, bratwurst, hot dogs, ribs, corned beef, ground pork, and regular ground beef.  Fried foods. Limit saturated fats in your diet. Substituting unsaturated fat for saturated fat may decrease your blood triglyceride level. You will need to read package labels to know which products contain saturated fats.  These foods are high in saturated fat. Eat LESS of these:   Fried pork skins.  Whole milk.  Skin and fat from poultry.  Palm oil.  Butter.  Shortening.  Cream cheese.  Tomasa Blase.  Margarines and baked goods made from listed oils.  Vegetable shortenings.  Chitterlings.  Fat from meats.  Coconut oil.  Palm kernel oil.  Lard.  Cream.  Sour cream.  Fatback.  Coffee whiteners and non-dairy creamers made with these oils.  Cheese made from whole milk. Use unsaturated fats (both polyunsaturated and monounsaturated) moderately. Remember, even though unsaturated fats are better than saturated fats; you still want a diet  low in total fat.  These foods are high in unsaturated fat:   Canola oil.  Sunflower oil.  Mayonnaise.  Almonds.  Peanuts.  Pine nuts.  Margarines made with these oils.  Safflower oil.  Olive oil.  Avocados.  Cashews.  Peanut butter.  Sunflower seeds.  Soybean oil.  Peanut oil.  Olives.  Pecans.  Walnuts.  Pumpkin seeds. Avoid sugar and other high-sugar foods. This will decrease carbohydrates without decreasing other nutrients. Sugar in your food goes rapidly to your blood. When there is excess sugar in your blood, your liver may use it to make more triglycerides. Sugar also contains calories without other important nutrients.  Eat LESS of these:   Sugar, brown sugar, powdered sugar, jam, jelly, preserves, honey, syrup, molasses, pies, candy, cakes, cookies, frosting, pastries, colas, soft drinks, punches, fruit drinks, and regular gelatin.  Avoid alcohol. Alcohol, even more than sugar, may increase blood triglycerides. In addition, alcohol is high in calories and low in nutrients. Ask for sparkling water, or a diet soft drink instead of an alcoholic beverage. Suggestions for planning and preparing meals   Bake, broil, grill or roast meats instead of frying.  Remove fat from meats  and skin from poultry before cooking.  Add spices, herbs, lemon juice or vinegar to vegetables instead of salt, rich sauces or gravies.  Use a non-stick skillet without fat or use no-stick sprays.  Cool and refrigerate stews and broth. Then remove the hardened fat floating on the surface before serving.  Refrigerate meat drippings and skim off fat to make low-fat gravies.  Serve more fish.  Use less butter, margarine and other high-fat spreads on bread or vegetables.  Use skim or reconstituted non-fat dry milk for cooking.  Cook with low-fat cheeses.  Substitute low-fat yogurt or cottage cheese for all or part of the sour cream in recipes for sauces, dips or congealed  salads.  Use half yogurt/half mayonnaise in salad recipes.  Substitute evaporated skim milk for cream. Evaporated skim milk or reconstituted non-fat dry milk can be whipped and substituted for whipped cream in certain recipes.  Choose fresh fruits for dessert instead of high-fat foods such as pies or cakes. Fruits are naturally low in fat. When Dining Out   Order low-fat appetizers such as fruit or vegetable juice, pasta with vegetables or tomato sauce.  Select clear, rather than cream soups.  Ask that dressings and gravies be served on the side. Then use less of them.  Order foods that are baked, broiled, poached, steamed, stir-fried, or roasted.  Ask for margarine instead of butter, and use only a small amount.  Drink sparkling water, unsweetened tea or coffee, or diet soft drinks instead of alcohol or other sweet beverages. QUESTIONS AND ANSWERS ABOUT OTHER FATS IN THE BLOOD: SATURATED FAT, TRANS FAT, AND CHOLESTEROL What is trans fat? Trans fat is a type of fat that is formed when vegetable oil is hardened through a process called hydrogenation. This process helps makes foods more solid, gives them shape, and prolongs their shelf life. Trans fats are also called hydrogenated or partially hydrogenated oils.  What do saturated fat, trans fat, and cholesterol in foods have to do with heart disease? Saturated fat, trans fat, and cholesterol in the diet all raise the level of LDL "bad" cholesterol in the blood. The higher the LDL cholesterol, the greater the risk for coronary heart disease (CHD). Saturated fat and trans fat raise LDL similarly.  What foods contain saturated fat, trans fat, and cholesterol? High amounts of saturated fat are found in animal products, such as fatty cuts of meat, chicken skin, and full-fat dairy products like butter, whole milk, cream, and cheese, and in tropical vegetable oils such as palm, palm kernel, and coconut oil. Trans fat is found in some of the same  foods as saturated fat, such as vegetable shortening, some margarines (especially hard or stick margarine), crackers, cookies, baked goods, fried foods, salad dressings, and other processed foods made with partially hydrogenated vegetable oils. Small amounts of trans fat also occur naturally in some animal products, such as milk products, beef, and lamb. Foods high in cholesterol include liver, other organ meats, egg yolks, shrimp, and full-fat dairy products. How can I use the new food label to make heart-healthy food choices? Check the Nutrition Facts panel of the food label. Choose foods lower in saturated fat, trans fat, and cholesterol. For saturated fat and cholesterol, you can also use the Percent Daily Value (%DV): 5% DV or less is low, and 20% DV or more is high. (There is no %DV for trans fat.) Use the Nutrition Facts panel to choose foods low in saturated fat and cholesterol, and if the trans fat  is not listed, read the ingredients and limit products that list shortening or hydrogenated or partially hydrogenated vegetable oil, which tend to be high in trans fat. POINTS TO REMEMBER:   Discuss your risk for heart disease with your caregivers, and take steps to reduce risk factors.  Change your diet. Choose foods that are low in saturated fat, trans fat, and cholesterol.  Add exercise to your daily routine if it is not already being done. Participate in physical activity of moderate intensity, like brisk walking, for at least 30 minutes on most, and preferably all days of the week. No time? Break the 30 minutes into three, 10-minute segments during the day.  Stop smoking. If you do smoke, contact your caregiver to discuss ways in which they can help you quit.  Do not use street drugs.  Maintain a normal weight.  Maintain a healthy blood pressure.  Keep up with your blood work for checking the fats in your blood as directed by your caregiver. Document Released: 04/24/2004 Document Revised:  01/06/2012 Document Reviewed: 11/20/2008 [facility] Patient Information 2014 [place], [place].

## 2013-12-21 NOTE — Progress Notes (Signed)
   Subjective:    Patient ID: Erica Baker, female    DOB: 01-25-1946, 68 y.o.   MRN: [id number]  HPI  Erica Baker is here for f/u for HLD and HTN.  HTN Disease Monitoring:occasional  Home BP Monitoring sometimes Chest pain- no    Dyspnea- no Medications:Lisinopril/HCTZ, amlodipine Compliance-  yes. Lightheadedness-  no  Edema- no   She has taken a statin previously and had some constipation associated with it. She doesn't remember what statin it was. She reports to eating fried food and not watching what she eats. She has previously been able to control her HLD with diet and exercise.    Current Outpatient Prescriptions on File Prior to Visit  Medication Sig Dispense Refill  . amLODipine (NORVASC) 5 MG tablet Take 1 tablet (5 mg total) by mouth daily.  90 tablet  1  . aspirin EC 81 MG tablet Take 81 mg by mouth daily.      Marland Kitchen gabapentin (NEURONTIN) 300 MG capsule Take 300 mg by mouth at bedtime.      Marland Kitchen lisinopril-hydrochlorothiazide (PRINZIDE,ZESTORETIC) 20-25 MG per tablet Take 1 tablet by mouth daily.  90 tablet  1  . pravastatin (PRAVACHOL) 40 MG tablet Take 1 tablet (40 mg total) by mouth daily.  90 tablet  1  . traMADol (ULTRAM) 50 MG tablet Take 50 mg by mouth 2 (two) times daily as needed.       No current facility-administered medications on file prior to visit.    SHx: still everyday smoker  Health Maintenance: Will try to acquire old medical records as she reports to being up to date on her health maintenance.   Review of Systems See HPI     Objective:   Physical Exam BP 110/75  Pulse 76  Temp(Src) 98.2 F (36.8 C) (Oral)  Wt 223 lb (101.152 kg) Gen: NAD, alert, cooperative with exam, well-appearing CV: RRR, good S1/S2, no murmur, no edema, capillary refill brisk  Resp: CTABL, no wheezes, non-labored     Assessment & Plan:

## 2013-12-22 DIAGNOSIS — E785 Hyperlipidemia, unspecified: Secondary | ICD-10-CM | POA: Insufficient documentation

## 2013-12-22 NOTE — Assessment & Plan Note (Signed)
Well-controlled.  Continue with current regimen. 

## 2013-12-22 NOTE — Assessment & Plan Note (Signed)
Lipid profile suggestive of starting a statin. CAD risk factors: increased age, smoker, HTN, obese. Does take a daily ASA. Would like to try to control with diet and exercise.  - f/u in 3 months to determine if controlled  - may start statin on a weekly dose or two times weekly in order to be better tolerated.  - direct LDL at F/u in 3 months.

## 2014-05-02 ENCOUNTER — Encounter: Payer: Self-pay | Admitting: Family Medicine

## 2014-05-02 ENCOUNTER — Ambulatory Visit (INDEPENDENT_AMBULATORY_CARE_PROVIDER_SITE_OTHER): Payer: Medicare Other | Admitting: Family Medicine

## 2014-05-02 VITALS — BP 136/75 | HR 83 | Temp 98.0°F | Resp 18 | Wt 224.0 lb

## 2014-05-02 DIAGNOSIS — I10 Essential (primary) hypertension: Secondary | ICD-10-CM | POA: Diagnosis not present

## 2014-05-02 DIAGNOSIS — E785 Hyperlipidemia, unspecified: Secondary | ICD-10-CM

## 2014-05-02 DIAGNOSIS — N3941 Urge incontinence: Secondary | ICD-10-CM | POA: Diagnosis not present

## 2014-05-02 LAB — POCT URINALYSIS DIPSTICK
Bilirubin, UA: NEGATIVE
Blood, UA: NEGATIVE
Glucose, UA: NEGATIVE
KETONES UA: NEGATIVE
Leukocytes, UA: NEGATIVE
Nitrite, UA: NEGATIVE
PROTEIN UA: NEGATIVE
SPEC GRAV UA: 1.01
Urobilinogen, UA: 0.2
pH, UA: 7

## 2014-05-02 MED ORDER — LISINOPRIL-HYDROCHLOROTHIAZIDE 20-25 MG PO TABS: 1.0000 | ORAL_TABLET | Freq: Every day | ORAL | Status: DC

## 2014-05-02 MED ORDER — AMLODIPINE BESYLATE 5 MG PO TABS: 5.0000 mg | ORAL_TABLET | Freq: Every day | ORAL | Status: DC

## 2014-05-02 NOTE — Progress Notes (Signed)
   Subjective:    Patient ID: Erica Baker, female    DOB: [birth year], 68 y.o.   MRN: [id number]  HPI  Erica Baker is here for f/u for HLD and presenting with urge incontinence.   Urge incontinence: having increased frequency in voiding. She feels like she has to void and then cannot make it to the restroom. She has had one vaginal birth about thirty years ago. She denies any burning with voiding or discharge. Reports normal bowel function. This has been doing on for 5 years but more progressive in the last 4-5 months. Will have nocturia 4 times per night.   She doesn't want to start a stain. Wants to come off of her aspirin. Continues to smoke with no hint at stopping. .   Current Outpatient Prescriptions on File Prior to Visit  Medication Sig Dispense Refill  . aspirin EC 81 MG tablet Take 81 mg by mouth daily.      Marland Kitchen. gabapentin (NEURONTIN) 300 MG capsule Take 300 mg by mouth at bedtime.      . pravastatin (PRAVACHOL) 40 MG tablet Take 1 tablet (40 mg total) by mouth daily.  90 tablet  1  . traMADol (ULTRAM) 50 MG tablet Take 50 mg by mouth 2 (two) times daily as needed.       No current facility-administered medications on file prior to visit.    SHx: continues to smoke   Health Maintenance: needs flu vaccine.    Review of Systems See HPI     Objective:   Physical Exam BP 136/75  Pulse 83  Temp(Src) 98 F (36.7 C) (Oral)  Resp 18  Wt 224 lb (101.606 kg)  SpO2 98% Gen: NAD, alert, cooperative with exam, well-appearing CV: RRR, good S1/S2, no murmur Resp: CTABL, no wheezes, non-labored Skin: no rashes, normal turgor  Neuro: no gross deficits.      Assessment & Plan:

## 2014-05-02 NOTE — Patient Instructions (Signed)
Thank you for coming in,   Try the kegel exercises and if they don't work give me a call.   Try schedule voiding. That way you avoid any accidents and your body gets used to going certain times.   Try not to drink any fluids after 9 pm.    Please feel free to call with any questions or concerns at any time, at [phone]. --Dr. Jordan LikesSchmitz  Kegel Exercises The goal of Kegel exercises is to isolate and exercise your pelvic floor muscles. These muscles act as a hammock that supports the rectum, vagina, small intestine, and uterus. As the muscles weaken, the hammock sags and these organs are displaced from their normal positions. Kegel exercises can strengthen your pelvic floor muscles and help you to improve bladder and bowel control, improve sexual response, and help reduce many problems and some discomfort during pregnancy. Kegel exercises can be done anywhere and at any time. HOW TO PERFORM KEGEL EXERCISES 1. Locate your pelvic floor muscles. To do this, squeeze (contract) the muscles that you use when you try to stop the flow of urine. You will feel a tightness in the vaginal area (women) and a tight lift in the rectal area (men and women). 2. When you begin, contract your pelvic muscles tight for 2-5 seconds, then relax them for 2-5 seconds. This is one set. Do 4-5 sets with a short pause in between. 3. Contract your pelvic muscles for 8-10 seconds, then relax them for 8-10 seconds. Do 4-5 sets. If you cannot contract your pelvic muscles for 8-10 seconds, try 5-7 seconds and work your way up to 8-10 seconds. Your goal is 4-5 sets of 10 contractions each day. Keep your stomach, buttocks, and legs relaxed during the exercises. Perform sets of both short and long contractions. Vary your positions. Perform these contractions 3-4 times per day. Perform sets while you are:   Lying in bed in the morning.  Standing at lunch.  Sitting in the late afternoon.  Lying in bed at night. You should do 40-50  contractions per day. Do not perform more Kegel exercises per day than recommended. Overexercising can cause muscle fatigue. Continue these exercises for for at least 15-20 weeks or as directed by your caregiver. Document Released: 06/23/2012 Document Reviewed: 06/23/2012 [facility] [place] Patient Information 2015 [place], [place]. This information is not intended to replace advice given to you by your health care provider. Make sure you discuss any questions you have with your health care provider.

## 2014-05-03 DIAGNOSIS — N3941 Urge incontinence: Secondary | ICD-10-CM | POA: Insufficient documentation

## 2014-05-03 NOTE — Assessment & Plan Note (Signed)
Still not willing to start a statin.  - continue statin  - encouraged to stop smoking.

## 2014-05-03 NOTE — Assessment & Plan Note (Signed)
Has been ongoing for 5 years. Worsening the pat 4-5 months.  - UA: no signs of infection or hematuria  - will try kegels for trial of two weeks. Will not have any benefit if bladder size is small  - if no improvement with kegels, then trial of medication.  - encouraged scheduled voiding to have biofeedback  - consider referral to urology if would like to consider PT

## 2014-05-10 ENCOUNTER — Other Ambulatory Visit: Payer: Self-pay | Admitting: Family Medicine

## 2014-05-11 NOTE — Telephone Encounter (Signed)
Spoke with patient and informed her of below 

## 2014-10-09 ENCOUNTER — Telehealth: Payer: Self-pay | Admitting: Family Medicine

## 2014-10-09 NOTE — Telephone Encounter (Signed)
Has some symptons since Thursday:  After walking 4-5 steps gets swimmy head While getting out of bed on Saturday, turned head very quickly and was dizzy. This occurred off and on since thurday. Blood pressure has been level. 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 Is having dental surgery in early April.  What kind of medicine can she take?

## 2014-10-12 NOTE — Telephone Encounter (Signed)
Seems to be getting better. Didn't have symptoms of aneurysm but she is concerned that she has had aneurysm previously. She would stand up quickly and feel lightheaded. She would sit back down and it would resolved. This occurred about once a day. She reports drinking plenty of water throughout the day.  She reports urinating frequently and has a history of low K. She is on HCTZ/Lisinopril. She doesn't have any other symptoms. She is feeling better.   Will continue to monitor. She seems to be getting better. Will order BMP on appt at April 7 since she is too busy to come in before that time for a lab draw.   Myra RudeJeremy E Schmitz, MD PGY-2, [facility] 10/12/2014, 12:31 PM

## 2014-10-26 ENCOUNTER — Encounter: Payer: Self-pay | Admitting: Family Medicine

## 2014-10-26 ENCOUNTER — Ambulatory Visit (INDEPENDENT_AMBULATORY_CARE_PROVIDER_SITE_OTHER): Payer: Medicare Other | Admitting: Family Medicine

## 2014-10-26 VITALS — BP 142/76 | HR 87 | Temp 98.5°F | Ht 68.0 in | Wt 221.2 lb

## 2014-10-26 DIAGNOSIS — K08109 Complete loss of teeth, unspecified cause, unspecified class: Secondary | ICD-10-CM | POA: Diagnosis not present

## 2014-10-26 DIAGNOSIS — R42 Dizziness and giddiness: Secondary | ICD-10-CM | POA: Diagnosis not present

## 2014-10-26 DIAGNOSIS — N3281 Overactive bladder: Secondary | ICD-10-CM | POA: Diagnosis not present

## 2014-10-26 DIAGNOSIS — N3941 Urge incontinence: Secondary | ICD-10-CM | POA: Diagnosis not present

## 2014-10-26 MED ORDER — TOLTERODINE TARTRATE 1 MG PO TABS: 2.0000 mg | ORAL_TABLET | Freq: Two times a day (BID) | ORAL | Status: AC

## 2014-10-26 NOTE — Progress Notes (Signed)
   Subjective:    Patient ID: Jessica PriestGlenda L Smail, female    DOB: March 07, 1946, 69 y.o.   MRN: [id number]  HPI  Jessica PriestGlenda L Lynne is here for HTN f/u.  Urinary frequency: She reports that this is the same problem she had when I saw her last year.  She has been trying to perform kegels but with no improvement. She seems to have to get up more at night then during the day.    Lightheadedness: occurs when she stands suddenly. This has never happeend before. This occurs to be improving. Doesn't think that it is realted to her previous anyerueym.   Dentist: two of her crowns fell out. Her dentist wants her to get a Care[employer] advisersurgeon. 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   Current Outpatient Prescriptions on File Prior to Visit  Medication Sig Dispense Refill  . amLODipine (NORVASC) 5 MG tablet Take 1 tablet (5 mg total) by mouth daily. 90 tablet 1  . amLODipine (NORVASC) 5 MG tablet take 1 tablet by mouth once daily 90 tablet 1  . aspirin EC 81 MG tablet Take 81 mg by mouth daily.    Marland Kitchen. gabapentin (NEURONTIN) 300 MG capsule Take 300 mg by mouth at bedtime.    Marland Kitchen. lisinopril-hydrochlorothiazide (PRINZIDE,ZESTORETIC) 20-25 MG per tablet Take 1 tablet by mouth daily. 90 tablet 1  . lisinopril-hydrochlorothiazide (PRINZIDE,ZESTORETIC) 20-25 MG per tablet take 1 tablet by mouth once daily 90 tablet 1  . pravastatin (PRAVACHOL) 40 MG tablet Take 1 tablet (40 mg total) by mouth daily. 90 tablet 1  . traMADol (ULTRAM) 50 MG tablet Take 50 mg by mouth 2 (two) times daily as needed.     No current facility-administered medications on file prior to visit.   SHx: still smoking and not interested in quitting.   Health Maintenance: needs colonscopy and mammogram. Information given    Review of Systems See HPI     Objective:   Physical Exam BP 142/76 mmHg  Pulse 87  Temp(Src) 98.5 F (36.9 C) (Oral)  Ht 5\' 8"  (1.727 m)  Wt 221 lb 3.2 oz (100.336 kg)  BMI 33.64 kg/m2 Gen: NAD,  alert, cooperative with exam, elderly female  HEENT: poor dentition  CV: RRR, good S1/S2, no murmur, trace edema, capillary refill brisk  Resp: CTABL, no wheezes, non-labored Abd: protuberant  Skin: no rashes, normal turgor   Lying BP 138/81 HR 76 Sitting BP 113/77 HR 80 Standing BP 133/88 HR 93      Assessment & Plan:

## 2014-10-26 NOTE — Patient Instructions (Signed)
Thank you for coming in,   I will call your surgeon to see if they there is any other clearance that he wants.    Please feel free to call with any questions or concerns at any time, at [phone]. --Dr. Jordan LikesSchmitz

## 2014-10-28 ENCOUNTER — Encounter: Payer: Self-pay | Admitting: Family Medicine

## 2014-10-28 DIAGNOSIS — R42 Dizziness and giddiness: Secondary | ICD-10-CM | POA: Insufficient documentation

## 2014-10-28 DIAGNOSIS — K08109 Complete loss of teeth, unspecified cause, unspecified class: Secondary | ICD-10-CM | POA: Insufficient documentation

## 2014-10-28 NOTE — Assessment & Plan Note (Signed)
No improvement with exercises. Consider PT but she doesn't want to be referred again.  - trial of detrol and titrate up as needed.  - Discussed with Dr. Raymondo BandKoval

## 2014-10-28 NOTE — Assessment & Plan Note (Addendum)
Doesn't occur when she is rolling over in bed but when she stands. Orthostatics were positive from lying to standing in systolic. She reports that it is getting better. 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 S/p surgery for aneurysm, unsure if coiled or ablation (cannot find any documentation on chart review)  - will monitor for now.  - if it continues may need to consider another ECHO or MRA head  - f/u in three months

## 2014-10-28 NOTE — Assessment & Plan Note (Signed)
She's had three caps break off from their roots on the right lower mandible. Having tooth extraction but not planned yet. Surgeon plans on using NO2 and local instead of general anesthesia.  Erica Baker- Joseph Miller [phone] - will need to call and ask if any questions regarding her hx of aneurysm.

## 2014-11-03 ENCOUNTER — Encounter: Payer: Self-pay | Admitting: Family Medicine

## 2014-11-03 NOTE — Progress Notes (Signed)
Patient left a message on medical records line saying that a letter was supposed to be sent to her dentist for medical clearance.  She would like it faxed to [phone].

## 2014-11-06 ENCOUNTER — Telehealth: Payer: Self-pay | Admitting: Family Medicine

## 2014-11-06 NOTE — Telephone Encounter (Signed)
Pt called to check the status of forms that needed to be filled out by her PCP so that she can have oral surgery on 4/21. Please call patient and let her know what the status is. Myriam Jacobsonjw

## 2014-11-07 ENCOUNTER — Encounter: Payer: Self-pay | Admitting: Family Medicine

## 2014-11-07 NOTE — Telephone Encounter (Signed)
Reviewed medical chart and pt does not seem to be at any increased risk for the dental procedure with her medical conditions. Letter was printed out and left in the to be faxed pile. If she needs this sooner the front office staff can reprint this letter and give her a physical copy.

## 2014-11-07 NOTE — Progress Notes (Signed)
Responded to in other telephone encounter.

## 2014-11-08 NOTE — Telephone Encounter (Signed)
Contacted pt to let her know that the form had been faxed and received at her oral surgeons office. Lamonte SakaiZimmerman Rumple, April D

## 2015-05-04 ENCOUNTER — Telehealth: Payer: Self-pay | Admitting: Family Medicine

## 2015-05-04 DIAGNOSIS — I10 Essential (primary) hypertension: Secondary | ICD-10-CM

## 2015-05-04 NOTE — Telephone Encounter (Signed)
Pt called and needs a 30 day refill on her Lisinopril and Amlodipine. She has made an appointment for 11/11 to see the doctor but will not have enough medication to last until then. jw

## 2015-05-07 MED ORDER — AMLODIPINE BESYLATE 5 MG PO TABS: 5.0000 mg | ORAL_TABLET | Freq: Every day | ORAL | Status: DC

## 2015-05-07 MED ORDER — LISINOPRIL-HYDROCHLOROTHIAZIDE 20-25 MG PO TABS: 1.0000 | ORAL_TABLET | Freq: Every day | ORAL | Status: DC

## 2015-05-08 ENCOUNTER — Other Ambulatory Visit: Payer: Self-pay | Admitting: Family Medicine

## 2015-06-01 ENCOUNTER — Ambulatory Visit (INDEPENDENT_AMBULATORY_CARE_PROVIDER_SITE_OTHER): Payer: Medicare Other | Admitting: Family Medicine

## 2015-06-01 ENCOUNTER — Encounter: Payer: Self-pay | Admitting: Family Medicine

## 2015-06-01 VITALS — BP 118/78 | HR 80 | Temp 98.3°F | Wt 217.0 lb

## 2015-06-01 DIAGNOSIS — E785 Hyperlipidemia, unspecified: Secondary | ICD-10-CM

## 2015-06-01 DIAGNOSIS — Z720 Tobacco use: Secondary | ICD-10-CM

## 2015-06-01 DIAGNOSIS — I10 Essential (primary) hypertension: Secondary | ICD-10-CM

## 2015-06-01 DIAGNOSIS — Z1159 Encounter for screening for other viral diseases: Secondary | ICD-10-CM

## 2015-06-01 LAB — LIPID PANEL
CHOLESTEROL: 187 mg/dL (ref 125–200)
HDL: 38 mg/dL — AB (ref >=46)
LDL Cholesterol: 123 mg/dL (ref <130)
TRIGLYCERIDES: 128 mg/dL (ref <150)
Total CHOL/HDL Ratio: 4.9 Ratio (ref <=5.0)
VLDL: 26 mg/dL (ref <30)

## 2015-06-01 LAB — CBC
HCT: 42.3 % (ref 36.0–46.0)
Hemoglobin: 14.4 g/dL (ref 12.0–15.0)
MCH: 30.5 pg (ref 26.0–34.0)
MCHC: 34 g/dL (ref 30.0–36.0)
MCV: 89.6 fL (ref 78.0–100.0)
MPV: 10.8 fL (ref 8.6–12.4)
PLATELETS: 582 10*3/uL — AB (ref 150–400)
RBC: 4.72 MIL/uL (ref 3.87–5.11)
RDW: 17.2 % — ABNORMAL HIGH (ref 11.5–15.5)
WBC: 5.7 10*3/uL (ref 4.0–10.5)

## 2015-06-01 LAB — BASIC METABOLIC PANEL WITH GFR
BUN: 8 mg/dL (ref 7–25)
CALCIUM: 9.7 mg/dL (ref 8.6–10.4)
CO2: 31 mmol/L (ref 20–31)
Chloride: 96 mmol/L — ABNORMAL LOW (ref 98–110)
Creat: 0.61 mg/dL (ref 0.50–0.99)
Glucose, Bld: 111 mg/dL — ABNORMAL HIGH (ref 65–99)
Potassium: 4.3 mmol/L (ref 3.5–5.3)
Sodium: 138 mmol/L (ref 135–146)

## 2015-06-01 MED ORDER — LISINOPRIL-HYDROCHLOROTHIAZIDE 20-25 MG PO TABS: 1.0000 | ORAL_TABLET | Freq: Every day | ORAL | Status: DC

## 2015-06-01 MED ORDER — AMLODIPINE BESYLATE 5 MG PO TABS: 5.0000 mg | ORAL_TABLET | Freq: Every day | ORAL | Status: DC

## 2015-06-01 NOTE — Assessment & Plan Note (Signed)
Long history of tobacco use Has quit one time cold [place]  - given 1800 quit line card  - may refer to Dr. Raymondo BandKoval for continued counseling.

## 2015-06-01 NOTE — Assessment & Plan Note (Signed)
Controlled  - continue current regimen  - labs today

## 2015-06-01 NOTE — Assessment & Plan Note (Signed)
Resistant to taking a statin - lipid panel today

## 2015-06-01 NOTE — Progress Notes (Signed)
Subjective:    Erica Baker - 69 y.o. female MRN [id number]  Date of birth: 02/11/1946  HPI  Erica Baker is here for chronic conditions.  HTN Disease Monitoring: Home BP Monitoring  Medications:amlodipine, lisinopril, HCTZ  Chest pain- no     Dyspnea- no Compliance-  yes.  Lightheadedness-  no   Edema- no  HYPERLIPIDEMIA Symptoms Chest pain on exertion:  no  . Leg claudication:   no Medications: pravastatin  Compliance-  none Right upper quadrant pain- no   Muscle aches- no     Component Value Date/Time   CHOL 178 11/15/2013 1631   TRIG 110 11/15/2013 1631   HDL 47 11/15/2013 1631   VLDL 22 11/15/2013 1631   CHOLHDL 3.8 11/15/2013 1631   Tobacco abuse  Depends 3/4 ppd  Only worried about how it is affecting her health  Quit one time previously, cold [place]  No one else smokes around her or in her house.   Health Maintenance:  Denied flu, tdap, PNA vaccine. Hep c screening obtained.  Health Maintenance Due  Topic Date Due  . Hepatitis C Screening  [birth year]  . TETANUS/TDAP  06/10/1965  . MAMMOGRAM  06/10/1996  . COLONOSCOPY  06/10/1996  . ZOSTAVAX  06/10/2006  . DEXA SCAN  06/11/2011  . PNA vac Low Risk Adult (1 of 2 - PCV13) 06/11/2011  . INFLUENZA VACCINE  02/19/2015    -  reports that she has been smoking Cigarettes.  She has a 40.5 pack-year smoking history. She does not have any smokeless tobacco history on file. - Review of Systems: Per HPI. - Past Medical History: Patient Active Problem List   Diagnosis Date Noted  . Tobacco abuse 06/01/2015  . Teeth missing 10/28/2014  . Urge incontinence 05/03/2014  . Hyperlipidemia 12/22/2013  . 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 HTN (hypertension), benign 11/15/2013   - Medications: reviewed and updated Current Outpatient Prescriptions  Medication Sig Dispense Refill  . amLODipine (NORVASC) 5 MG tablet Take 1 tablet (5 mg total) by mouth daily. 90 tablet 1  . aspirin EC 81 MG tablet Take 81 mg by  mouth daily.    Marland Kitchen gabapentin (NEURONTIN) 300 MG capsule Take 300 mg by mouth at bedtime.    Marland Kitchen lisinopril-hydrochlorothiazide (PRINZIDE,ZESTORETIC) 20-25 MG tablet Take 1 tablet by mouth daily. 90 tablet 1  . tolterodine (DETROL) 1 MG tablet Take 2 tablets (2 mg total) by mouth 2 (two) times daily. 60 tablet 0  . traMADol (ULTRAM) 50 MG tablet Take 50 mg by mouth 2 (two) times daily as needed.    . pravastatin (PRAVACHOL) 40 MG tablet Take 1 tablet (40 mg total) by mouth daily. (Patient not taking: Reported on 10/26/2014) 90 tablet 1   No current facility-administered medications for this visit.     Review of Systems See HPI     Objective:   Physical Exam BP 118/78 mmHg  Pulse 80  Temp(Src) 98.3 F (36.8 C) (Oral)  Wt 217 lb (98.431 kg)  SpO2 94% Gen: NAD, alert, cooperative with exam HEENT: NCAT, clear conjunctiva,  CV: RRR, good S1/S2, no murmur, no edema, capillary refill brisk  Resp: CTABL, no wheezes, non-labored Skin: no rashes, normal turgor  Neuro: no gross deficits.  Psych: alert and oriented     Assessment & Plan:   Hyperlipidemia Resistant to taking a statin - lipid panel today   HTN (hypertension), benign Controlled  - continue current regimen  - labs today    Tobacco abuse  Long history of tobacco use Has quit one time cold [place]  - given 1800 quit line card  - may refer to Dr. Raymondo BandKoval for continued counseling.

## 2015-06-01 NOTE — Patient Instructions (Signed)
Thank you for coming in,   I will call you or send a result of your labs today.   Please bring all of your medications with you to each visit.   Sign up for My Chart to have easy access to your labs results, and communication with your Primary care physician   Please feel free to call with any questions or concerns at any time, at [phone]. --Dr. Jordan LikesSchmitz

## 2015-06-02 LAB — TSH: TSH: 0.807 u[IU]/mL (ref 0.350–4.500)

## 2015-06-02 LAB — HEPATITIS C ANTIBODY: HCV AB: NEGATIVE

## 2015-06-04 ENCOUNTER — Encounter: Payer: Self-pay | Admitting: Family Medicine

## 2015-06-11 ENCOUNTER — Telehealth: Payer: Self-pay | Admitting: Family Medicine

## 2015-06-11 NOTE — Telephone Encounter (Signed)
Patient received letter from Dr. Jordan LikesSchmitz this weekend with her lab results but she does not understand any of them. She would like MD/nurse to call her and go over her results with her. Please call [phone]. Pt states to let the phone ring awhile. It takes her awhile to get to the phone.

## 2015-06-12 NOTE — Telephone Encounter (Signed)
Pt has called again about her test results. Says she has been calling since Friday  Please call her at home Please call before  Thanksgiving holiday

## 2015-06-13 NOTE — Telephone Encounter (Signed)
Spoke with patient about her lab results.   Myra RudeJeremy E Schmitz, MD PGY-3, [facility] 06/13/2015, 10:35 AM

## 2015-06-13 NOTE — Telephone Encounter (Signed)
Will forward to MD to call and discuss labs with patient. Jazmin Hartsell,CMA

## 2015-09-06 ENCOUNTER — Telehealth: Payer: Self-pay | Admitting: *Deleted

## 2015-09-11 NOTE — Telephone Encounter (Signed)
Called patient to offer flu vaccine.  Patient declined.  DUCATTE, LAURENZE L, RN     

## 2015-10-25 ENCOUNTER — Ambulatory Visit: Payer: Medicare Other | Admitting: Family Medicine

## 2015-11-06 ENCOUNTER — Encounter: Payer: Self-pay | Admitting: Family Medicine

## 2015-11-06 ENCOUNTER — Ambulatory Visit (INDEPENDENT_AMBULATORY_CARE_PROVIDER_SITE_OTHER): Payer: Medicare Other | Admitting: Family Medicine

## 2015-11-06 ENCOUNTER — Ambulatory Visit (HOSPITAL_COMMUNITY)
Admission: RE | Admit: 2015-11-06 | Discharge: 2015-11-06 | Disposition: A | Discharge status: Home / Self Care | Payer: Medicare Other | Source: Ambulatory Visit | Attending: Family Medicine | Admitting: Family Medicine

## 2015-11-06 VITALS — BP 118/60 | HR 76 | Temp 98.4°F | Ht 68.0 in | Wt 205.0 lb

## 2015-11-06 DIAGNOSIS — I491 Atrial premature depolarization: Secondary | ICD-10-CM | POA: Insufficient documentation

## 2015-11-06 DIAGNOSIS — I10 Essential (primary) hypertension: Secondary | ICD-10-CM | POA: Insufficient documentation

## 2015-11-06 DIAGNOSIS — R9431 Abnormal electrocardiogram [ECG] [EKG]: Secondary | ICD-10-CM | POA: Insufficient documentation

## 2015-11-06 DIAGNOSIS — Z0000 Encounter for general adult medical examination without abnormal findings: Secondary | ICD-10-CM | POA: Diagnosis not present

## 2015-11-06 MED ORDER — LISINOPRIL-HYDROCHLOROTHIAZIDE 20-25 MG PO TABS: 1.0000 | ORAL_TABLET | Freq: Every day | ORAL | Status: DC

## 2015-11-06 MED ORDER — AMLODIPINE BESYLATE 5 MG PO TABS: 5.0000 mg | ORAL_TABLET | Freq: Every day | ORAL | Status: DC

## 2015-11-06 NOTE — Patient Instructions (Signed)
Thank you for coming in,   I have sent refills in of your medication.   Please think about quitting smoking.  This is very important for your health.  There are many things we can do to help you quit.  Let  us know if you are interested.  You can also call 1-800-QUIT-NOW [phone] 194-6609([phone])604-5427-669) for free smoking cessation counseling.  Please bring all of your medications with you to each visit.   Sign up for My Chart to have easy access to your labs results, and communication with your Primary care physician   Please feel free to call with any questions or concerns at any time, at [phone]. --Dr. Jordan LikesSchmitz

## 2015-11-06 NOTE — Progress Notes (Signed)
   Subjective:    Erica Baker Fonda - 70 y.o. female MRN [id number]  Date of birth: 02-17-1946  CC annual check   HPI  Erica Baker Hagedorn is here for annual check.  General Healthcare: Medication Compliance: yes  Aspirin: yes Dx Hypertension: yes  Dx Hyperlipidemia: yes  Diabetes: no  Dx Obesity: yes, obese  Weight Loss: yes, she has been exercising more this year. Has a goal up getting below 200 lbs.  Physical Activity: yes  Urinary Incontinence: yes    Social: Driving: yes Alcohol Use: no  Tobacco Use: yes, 0.5 to 1 PPD   - Interested in Quitting: yes once she gets her other problems (leg and teeth) figured out  Other Drugs: no  Support and Life at Home: retired, her son lives with her at times.  Advanced Directives: no   Cancer:  Colorectal >> Colonoscopy: is opposed to doing it.   Lung >> Tobacco Use: yes   - If so, previous Low-Dose CT screen: doesn't want to have it performed   Breast >> [facility]: [facility] Cervical/Endometrial >>  - Postmenopausal: yes - Hysterectomy: yes, complete in 2007  - Vaginal Bleeding: none Skin >> Suspicious lesions: no   Other: Osteoporosis: wants to wait   Zoster Vaccine: doesn't want currently  Pneumonia Vaccine: wants to wait.    PMH: Chronic pain (reports she had a fall and now has pain in her right leg and sciatica) , aneurysm, HTN   Health Maintenance:  Health Maintenance Due  Topic Date Due  . TETANUS/TDAP  06/10/1965  . MAMMOGRAM  06/10/1996  . COLONOSCOPY  06/10/1996  . ZOSTAVAX  06/10/2006  . DEXA SCAN  06/11/2011  . PNA vac Low Risk Adult (1 of 2 - PCV13) 06/11/2011    Review of Systems See HPI     Objective:   Physical Exam BP 118/60 mmHg  Pulse 76  Temp(Src) 98.4 F (36.9 C) (Oral)  Ht 5\' 8"  (1.727 m)  Wt 205 lb (92.987 kg)  BMI 31.18 kg/m2 Gen: NAD, alert, cooperative with exam,  HEENT: clear conjunctiva, CV: RRR, good S1/S2, no murmur, no edema,   Resp: CTABL, no  wheezes, non-labored Skin: no rashes, normal turgor  Neuro: no gross deficits.     Assessment & Plan:   HTN (hypertension), benign Well controlled - continue current medications  Annual physical exam She has an ongoing issue with her teeth that she would like to get done before she starts doing any screening  Discussed advanced directives and she will start putting more thought into this.  - f/u in one year.

## 2015-11-07 DIAGNOSIS — Z0000 Encounter for general adult medical examination without abnormal findings: Secondary | ICD-10-CM | POA: Insufficient documentation

## 2015-11-07 NOTE — Assessment & Plan Note (Signed)
Well controlled continue current medications  

## 2015-11-07 NOTE — Assessment & Plan Note (Signed)
She has an ongoing issue with her teeth that she would like to get done before she starts doing any screening  Discussed advanced directives and she will start putting more thought into this.  - f/u in one year.

## 2016-04-30 ENCOUNTER — Telehealth: Payer: Self-pay | Admitting: Family Medicine

## 2016-04-30 DIAGNOSIS — I10 Essential (primary) hypertension: Secondary | ICD-10-CM

## 2016-04-30 MED ORDER — AMLODIPINE BESYLATE 5 MG PO TABS: 5.0000 mg | ORAL_TABLET | Freq: Every day | ORAL | 1 refills | Status: DC

## 2016-04-30 MED ORDER — LISINOPRIL-HYDROCHLOROTHIAZIDE 20-25 MG PO TABS: 1.0000 | ORAL_TABLET | Freq: Every day | ORAL | 1 refills | Status: DC

## 2016-04-30 NOTE — Telephone Encounter (Signed)
Medications sent to pharmacy

## 2016-04-30 NOTE — Telephone Encounter (Signed)
Pt called and needs a refill on her Lisinopril and Amlodipine called in. She only has 4 days worth of medication left. She has schedule an appointment for 05/22/16 which is when the doctor is next available . jw

## 2016-05-22 ENCOUNTER — Encounter: Payer: Self-pay | Admitting: Family Medicine

## 2016-05-22 ENCOUNTER — Ambulatory Visit: Payer: Medicare Other | Admitting: Family Medicine

## 2016-05-22 ENCOUNTER — Ambulatory Visit (INDEPENDENT_AMBULATORY_CARE_PROVIDER_SITE_OTHER): Payer: Medicare Other | Admitting: Family Medicine

## 2016-05-22 DIAGNOSIS — Z9189 Other specified personal risk factors, not elsewhere classified: Secondary | ICD-10-CM

## 2016-05-22 DIAGNOSIS — E785 Hyperlipidemia, unspecified: Secondary | ICD-10-CM

## 2016-05-22 DIAGNOSIS — I10 Essential (primary) hypertension: Secondary | ICD-10-CM

## 2016-05-22 DIAGNOSIS — Z720 Tobacco use: Secondary | ICD-10-CM

## 2016-05-22 MED ORDER — LISINOPRIL-HYDROCHLOROTHIAZIDE 20-25 MG PO TABS: 1.0000 | ORAL_TABLET | Freq: Every day | ORAL | 2 refills | Status: AC

## 2016-05-22 MED ORDER — AMLODIPINE BESYLATE 5 MG PO TABS: 5.0000 mg | ORAL_TABLET | Freq: Every day | ORAL | 2 refills | Status: DC

## 2016-05-22 NOTE — Progress Notes (Signed)
   Subjective:    Patient ID: Erica Baker , female   DOB: Nov 22, 1945 , 70 y.o..   MRN: [id number]  HPI  Erica Baker is here for  Chief Complaint  Patient presents with  . Medication Refill   Hypertension Blood pressure at home: Does not check Exercise: None Low salt diet: yes Medications: Compliant with Amlodipine 5 mg daily and Lisinopril-HCTZ 20-25 mg daily Side effects: does have some ankle swelling at times ROS: Denies headache, dizziness, visual changes, nausea, vomiting, chest pain, abdominal pain or shortness of breath. BP Readings from Last 3 Encounters:  05/22/16 112/80  11/06/15 118/60  06/01/15 118/78    Smoking: Doing better per her report. Has not quit yet. Smoking almost 1 pack per day. Is interested in quitting. Wants to try it on her own.   Hyperlipidemia: Not taking any cholesterol medication for awhile. She does not feel like she needs it.   Review of Systems: Per HPI.  Medications: reviewed and updated  Social Hx:  reports that she has been smoking Cigarettes.  She has a 40.50 pack-year smoking history. She does not have any smokeless tobacco history on file.    Objective:   BP 112/80   Pulse 81   Temp 98.1 F (36.7 C) (Oral)   Ht 5\' 8"  (1.727 m)   Wt 193 lb 9.6 oz (87.8 kg)   BMI 29.44 kg/m  Physical Exam  Gen: NAD, alert, cooperative with exam, well-appearing HEENT: NCAT, PERRL, clear conjunctiva, oropharynx clear, supple neck Cardiac: Regular rate and rhythm, normal S1/S2, no murmur, capillary refill brisk, 1+ pitting edema in bilateral ankles Respiratory: Clear to auscultation bilaterally, no wheezes, non-labored breathing Gastrointestinal: soft, non tender, non distended, bowel sounds present Skin: no rashes, normal turgor  Neurological: antalgic gait, walks with cane Psych: good insight, normal mood and affect   Assessment & Plan:  HTN (hypertension), benign Controlled. Continue current medications. Consider repeating BMP at  next appointment in 6 months for hypertension to monitor kidney function and electrolytes.   Hyperlipidemia Still not wanting to take statin and has not taken in a couple years. Lipid panel from Nov 2016 normal except for mildly low HDL at 38. ASCD risk 15.6% based on last lipid panel. Will go ahead and take statin off medication list as patient is not taking. Discussed benefits of taking statin including decreasing risk of heart attack and stroke. Patient understands.   Tobacco abuse Interested in quitting but wants to do it without assistance. Encouraged continued smoking cessation.   At risk for ineffective health maintenance Patient refusing flu shot, TDap, pneumovax, colonoscopy, and mammogram. Discussed risks of not having the above, patient expressed understanding.    Anders Simmondshristina Tayja Manzer, MD [facility], PGY-2

## 2016-05-22 NOTE — Patient Instructions (Signed)
Thank you for coming in today, it was so nice to see you! Today we talked about:    Blood pressure: Your pressure is great, please continue taking your medications as prescribed  Smoking: Please continue to try and cut back on cigarettes and eventually quit. This will be the best thing you can do for your health.   Please follow up in 6 months.   Bring in all your medications or supplements to each appointment for review.   If you have any questions or concerns, please do not hesitate to call the office at [phone]. You can also message me directly via MyChart.   Sincerely,  Anders Simmondshristina Beryle Zeitz, MD

## 2016-05-23 DIAGNOSIS — Z9189 Other specified personal risk factors, not elsewhere classified: Secondary | ICD-10-CM | POA: Insufficient documentation

## 2016-05-23 NOTE — Assessment & Plan Note (Signed)
Interested in quitting but wants to do it without assistance. Encouraged continued smoking cessation.

## 2016-05-23 NOTE — Assessment & Plan Note (Signed)
Patient refusing flu shot, TDap, pneumovax, colonoscopy, and mammogram. Discussed risks of not having the above, patient expressed understanding.

## 2016-05-23 NOTE — Assessment & Plan Note (Signed)
Controlled. Continue current medications. Consider repeating BMP at next appointment in 6 months for hypertension to monitor kidney function and electrolytes.

## 2016-05-23 NOTE — Assessment & Plan Note (Signed)
Still not wanting to take statin and has not taken in a couple years. Lipid panel from Nov 2016 normal except for mildly low HDL at 38. ASCD risk 15.6% based on last lipid panel. Will go ahead and take statin off medication list as patient is not taking. Discussed benefits of taking statin including decreasing risk of heart attack and stroke. Patient understands.

## 2016-11-06 ENCOUNTER — Other Ambulatory Visit: Payer: Self-pay | Admitting: Family Medicine

## 2016-11-06 DIAGNOSIS — I10 Essential (primary) hypertension: Secondary | ICD-10-CM

## 2016-12-10 ENCOUNTER — Telehealth: Payer: Self-pay | Admitting: Family Medicine

## 2016-12-10 NOTE — Telephone Encounter (Signed)
Patient is due for a follow-up appointment. Patient denied any issues with hypertension of kidney function. No appointment was scheduled. Will check in with PCP.

## 2017-04-20 DEATH — deceased
